# Patient Record
Sex: Female | Born: 1990
Health system: Southern US, Community
[De-identification: ages and names within clinical notes are randomized; demographics above are authoritative.]

## PROBLEM LIST (undated history)

## (undated) ENCOUNTER — Inpatient Hospital Stay (HOSPITAL_COMMUNITY): Payer: Self-pay

## (undated) DIAGNOSIS — R079 Chest pain, unspecified: Secondary | ICD-10-CM

## (undated) DIAGNOSIS — E538 Deficiency of other specified B group vitamins: Secondary | ICD-10-CM

## (undated) DIAGNOSIS — D649 Anemia, unspecified: Secondary | ICD-10-CM

## (undated) DIAGNOSIS — F419 Anxiety disorder, unspecified: Secondary | ICD-10-CM

## (undated) DIAGNOSIS — K219 Gastro-esophageal reflux disease without esophagitis: Secondary | ICD-10-CM

## (undated) DIAGNOSIS — R0600 Dyspnea, unspecified: Secondary | ICD-10-CM

## (undated) DIAGNOSIS — R7303 Prediabetes: Secondary | ICD-10-CM

## (undated) DIAGNOSIS — F209 Schizophrenia, unspecified: Secondary | ICD-10-CM

## (undated) DIAGNOSIS — F329 Major depressive disorder, single episode, unspecified: Secondary | ICD-10-CM

## (undated) DIAGNOSIS — M549 Dorsalgia, unspecified: Secondary | ICD-10-CM

## (undated) DIAGNOSIS — F319 Bipolar disorder, unspecified: Secondary | ICD-10-CM

## (undated) DIAGNOSIS — R002 Palpitations: Secondary | ICD-10-CM

## (undated) DIAGNOSIS — K5900 Constipation, unspecified: Secondary | ICD-10-CM

## (undated) DIAGNOSIS — R42 Dizziness and giddiness: Secondary | ICD-10-CM

## (undated) DIAGNOSIS — E559 Vitamin D deficiency, unspecified: Secondary | ICD-10-CM

## (undated) HISTORY — DX: Dizziness and giddiness: R42

## (undated) HISTORY — DX: Constipation, unspecified: K59.00

## (undated) HISTORY — DX: Bipolar disorder, unspecified: F31.9

## (undated) HISTORY — DX: Gastro-esophageal reflux disease without esophagitis: K21.9

## (undated) HISTORY — DX: Schizophrenia, unspecified: F20.9

## (undated) HISTORY — DX: Major depressive disorder, single episode, unspecified: F32.9

## (undated) HISTORY — DX: Anemia, unspecified: D64.9

## (undated) HISTORY — DX: Dyspnea, unspecified: R06.00

## (undated) HISTORY — DX: Deficiency of other specified B group vitamins: E53.8

## (undated) HISTORY — DX: Vitamin D deficiency, unspecified: E55.9

## (undated) HISTORY — DX: Dorsalgia, unspecified: M54.9

## (undated) HISTORY — DX: Anxiety disorder, unspecified: F41.9

## (undated) HISTORY — DX: Prediabetes: R73.03

## (undated) HISTORY — PX: HAND SURGERY: SHX662

## (undated) HISTORY — DX: Chest pain, unspecified: R07.9

## (undated) HISTORY — DX: Palpitations: R00.2

---

## 2009-08-25 ENCOUNTER — Ambulatory Visit (HOSPITAL_COMMUNITY): Admission: EM | Admit: 2009-08-25 | Discharge: 2009-08-25 | Payer: Self-pay | Admitting: Emergency Medicine

## 2010-01-27 ENCOUNTER — Emergency Department (HOSPITAL_COMMUNITY): Admission: EM | Admit: 2010-01-27 | Discharge: 2010-01-28 | Payer: Self-pay | Admitting: Emergency Medicine

## 2010-07-02 LAB — CBC
HCT: 38 % (ref 36.0–46.0)
Hemoglobin: 12.7 g/dL (ref 12.0–15.0)
WBC: 6.2 10*3/uL (ref 4.0–10.5)

## 2010-07-02 LAB — URINALYSIS, ROUTINE W REFLEX MICROSCOPIC
Bilirubin Urine: NEGATIVE
Nitrite: NEGATIVE
Protein, ur: NEGATIVE mg/dL
Urobilinogen, UA: 0.2 mg/dL (ref 0.0–1.0)

## 2010-07-02 LAB — POCT I-STAT, CHEM 8
Hemoglobin: 14.6 g/dL (ref 12.0–15.0)
Sodium: 139 mEq/L (ref 135–145)
TCO2: 22 mmol/L (ref 0–100)

## 2010-07-02 LAB — URINE MICROSCOPIC-ADD ON

## 2010-07-02 LAB — GC/CHLAMYDIA PROBE AMP, GENITAL
Chlamydia, DNA Probe: NEGATIVE
GC Probe Amp, Genital: NEGATIVE

## 2010-07-02 LAB — DIFFERENTIAL
Lymphocytes Relative: 4 % — ABNORMAL LOW (ref 12–46)
Monocytes Absolute: 0.2 10*3/uL (ref 0.1–1.0)
Monocytes Relative: 3 % (ref 3–12)
Neutro Abs: 5.7 10*3/uL (ref 1.7–7.7)

## 2010-07-02 LAB — WET PREP, GENITAL: Clue Cells Wet Prep HPF POC: NONE SEEN

## 2010-07-02 LAB — POCT PREGNANCY, URINE: Preg Test, Ur: NEGATIVE

## 2010-07-07 LAB — POCT I-STAT, CHEM 8
Glucose, Bld: 95 mg/dL (ref 70–99)
HCT: 41 % (ref 36.0–46.0)
Hemoglobin: 13.9 g/dL (ref 12.0–15.0)
Potassium: 4.1 mEq/L (ref 3.5–5.1)
Sodium: 141 mEq/L (ref 135–145)

## 2010-10-28 ENCOUNTER — Inpatient Hospital Stay (HOSPITAL_COMMUNITY)
Admission: EM | Admit: 2010-10-28 | Payer: Federal, State, Local not specified - PPO | Source: Ambulatory Visit | Admitting: Obstetrics and Gynecology

## 2010-10-28 ENCOUNTER — Inpatient Hospital Stay (HOSPITAL_COMMUNITY): Payer: Federal, State, Local not specified - PPO

## 2010-10-28 ENCOUNTER — Telehealth (HOSPITAL_COMMUNITY): Payer: Self-pay | Admitting: *Deleted

## 2010-10-28 ENCOUNTER — Encounter (HOSPITAL_COMMUNITY): Payer: Self-pay

## 2010-10-28 ENCOUNTER — Inpatient Hospital Stay (HOSPITAL_COMMUNITY)
Admission: EM | Admit: 2010-10-28 | Discharge: 2010-10-28 | Disposition: A | Discharge status: Home / Self Care | Payer: Federal, State, Local not specified - PPO | Source: Ambulatory Visit | Attending: Obstetrics and Gynecology | Admitting: Obstetrics and Gynecology

## 2010-10-28 DIAGNOSIS — O99891 Other specified diseases and conditions complicating pregnancy: Secondary | ICD-10-CM | POA: Insufficient documentation

## 2010-10-28 LAB — HCG, QUANTITATIVE, PREGNANCY: hCG, Beta Chain, Quant, S: 86089 m[IU]/mL — ABNORMAL HIGH (ref <5)

## 2010-10-28 LAB — ABO/RH: ABO/RH(D): O POS

## 2010-10-28 LAB — CBC
Hemoglobin: 12.2 g/dL (ref 12.0–15.0)
MCHC: 33.5 g/dL (ref 30.0–36.0)
RBC: 4.44 MIL/uL (ref 3.87–5.11)

## 2010-10-28 LAB — WET PREP, GENITAL: Clue Cells Wet Prep HPF POC: NONE SEEN

## 2010-10-28 MED ORDER — ONDANSETRON 8 MG PO TBDP: 8.0000 mg | ORAL_TABLET | Freq: Three times a day (TID) | ORAL | Status: AC | PRN

## 2010-10-28 MED ORDER — PROMETHAZINE HCL 25 MG PO TABS: 25.0000 mg | ORAL_TABLET | Freq: Four times a day (QID) | ORAL | Status: AC | PRN

## 2010-10-28 NOTE — ED Notes (Signed)
Wende Bushy, NP in to discuss hx and current status.

## 2010-10-28 NOTE — ED Notes (Signed)
To US

## 2010-10-28 NOTE — ED Notes (Signed)
Sydney Dominguez discussed results and D/C instrs. Pt. Was given a pregnancy verification letter and list of providers.

## 2010-10-28 NOTE — Progress Notes (Signed)
Pt states she has been having nausea and has vomited x 3 over the past 1 1/2 weeks. No nausea today. Pt was seen at [facility] [address] and diagnosed with pregnancy. Pt was told that during her exam her cervix was open 1 cm with white tissue seen and was sent to MAU for further evaluation. Pt is not having any pain or bleeding at this time.

## 2010-10-28 NOTE — ED Provider Notes (Addendum)
History     Chief Complaint  Patient presents with  . Referral    pt seen at [facility] with c/o of nausea for 1/2 weeks with neg HPT and stingy vaginal discharge for 2 days with some cramping.   HPI     Past Medical History  Diagnosis Date  . No pertinent past medical history     Past Surgical History  Procedure Date  . Hand surgery     No family history on file.  History  Substance Use Topics  . Smoking status: Never Smoker   . Smokeless tobacco: Not on file  . Alcohol Use: Yes     1 liquor drink per month    Allergies: No Known Allergies  Prescriptions prior to admission  Medication Sig Dispense Refill  . Ascorbic Acid (VITAMIN C PO) Take 1 tablet by mouth daily.        Marland Kitchen bismuth subsalicylate (PEPTO BISMOL) 262 MG/15ML suspension Take 15 mLs by mouth 2 (two) times daily. For upset stomach        . OVER THE COUNTER MEDICATION Take 2 tablets by mouth daily. Hair and nail supplement       . Polyethylene Glycol 3350 (MIRALAX PO) Take 0.5 capsules by mouth once.          Review of Systems  Constitutional: Negative.  Negative for fever and chills.  Gastrointestinal: Positive for nausea, vomiting, diarrhea and constipation.  Genitourinary: Negative for dysuria.   Physical Exam   Blood pressure 126/72, pulse 99, temperature 98.4 F (36.9 C), temperature source Oral, resp. rate 20, height 5\' 4"  (1.626 m), weight 221 lb 9.6 oz (100.517 kg), last menstrual period 10/13/2010.  Physical Exam  Vitals reviewed. Constitutional: She is oriented to person, place, and time. She appears well-developed and well-nourished.  HENT:  Head: Normocephalic.  Eyes: Pupils are equal, round, and reactive to light.  Neck: Neck supple.  Respiratory: Effort normal.  GI: Soft. She exhibits no distension and no mass. There is no tenderness. There is no rebound and no guarding.  Genitourinary: Vagina normal. No vaginal discharge found.       Cervix is dilated to 1.5 cm with  white tissue exposed.  No bleeding or discharge noted  Musculoskeletal: Normal range of motion.  Neurological: She is alert and oriented to person, place, and time.  Skin: Skin is warm and dry.  Psychiatric: She has a normal mood and affect. Her behavior is normal.    MAU Course  Procedures  MDM  Pregnancy with cervical dilatation Wet prep and GC/chlamydia cultures taken

## 2010-10-28 NOTE — ED Notes (Signed)
Returned from US  Awaiting report

## 2010-11-28 ENCOUNTER — Encounter (HOSPITAL_COMMUNITY): Payer: Self-pay | Admitting: *Deleted

## 2010-11-28 ENCOUNTER — Inpatient Hospital Stay (HOSPITAL_COMMUNITY)
Admission: AD | Admit: 2010-11-28 | Discharge: 2010-11-28 | Disposition: A | Discharge status: Home / Self Care | Payer: Federal, State, Local not specified - PPO | Source: Ambulatory Visit | Attending: Obstetrics & Gynecology | Admitting: Obstetrics & Gynecology

## 2010-11-28 DIAGNOSIS — O200 Threatened abortion: Secondary | ICD-10-CM | POA: Insufficient documentation

## 2010-11-28 DIAGNOSIS — O4590 Premature separation of placenta, unspecified, unspecified trimester: Secondary | ICD-10-CM | POA: Insufficient documentation

## 2010-11-28 DIAGNOSIS — O418X90 Other specified disorders of amniotic fluid and membranes, unspecified trimester, not applicable or unspecified: Secondary | ICD-10-CM

## 2010-11-28 LAB — URINALYSIS, ROUTINE W REFLEX MICROSCOPIC
Glucose, UA: NEGATIVE mg/dL
Ketones, ur: NEGATIVE mg/dL
Leukocytes, UA: NEGATIVE
pH: 7.5 (ref 5.0–8.0)

## 2010-11-28 LAB — CBC
HCT: 36.7 % (ref 36.0–46.0)
Hemoglobin: 12.4 g/dL (ref 12.0–15.0)
MCH: 27.9 pg (ref 26.0–34.0)
MCHC: 33.8 g/dL (ref 30.0–36.0)
MCV: 82.7 fL (ref 78.0–100.0)
RBC: 4.44 MIL/uL (ref 3.87–5.11)

## 2010-11-28 LAB — URINE MICROSCOPIC-ADD ON

## 2010-11-28 LAB — WET PREP, GENITAL: Clue Cells Wet Prep HPF POC: NONE SEEN

## 2010-11-28 NOTE — ED Provider Notes (Signed)
History     CSN: [id number] Arrival date & time: 11/28/2010 10:44 AM  Chief Complaint  Patient presents with  . Vaginal Bleeding   HPI Sydney Dominguez is a 20 y.o. AA female @ [redacted] weeks gestation who presents to MAU with vaginal bleeding that started last night with low back pain and lower abdominal cramping. She was evaluated here last month and had an ultrasound that showed a 7 week 4 days IUP with cardiac activity 146. There was a small SCH and small FF. She has not had sex since her previous visit. The history was provided by the patient.     Past Medical History  Diagnosis Date  . No pertinent past medical history     Past Surgical History  Procedure Date  . Hand surgery     No family history on file.  History  Substance Use Topics  . Smoking status: Never Smoker   . Smokeless tobacco: Not on file  . Alcohol Use: No    OB History    Grav Para Term Preterm Abortions TAB SAB Ect Mult Living   1               Review of Systems  Constitutional: Positive for fatigue. Negative for fever and chills.  HENT: Negative.   Eyes: Negative.   Respiratory: Negative.   Cardiovascular: Negative.   Gastrointestinal: Positive for abdominal pain. Negative for nausea and vomiting.  Genitourinary: Positive for frequency and vaginal bleeding. Negative for dyspareunia.  Musculoskeletal: Positive for back pain.  Skin: Negative.   Neurological: Negative for headaches.    Physical Exam  BP 131/69  Pulse 77  Temp(Src) 98.2 F (36.8 C) (Oral)  Resp 16  Ht 5\' 5"  (1.651 m)  Wt 222 lb 9.6 oz (100.971 kg)  BMI 37.04 kg/m2  LMP 10/13/2010  Physical Exam  Nursing note and vitals reviewed. Constitutional: She is oriented to person, place, and time. She appears well-developed and well-nourished.  HENT:  Head: Normocephalic.  Eyes: EOM are normal.  Neck: Neck supple.  Pulmonary/Chest: Effort normal.  Abdominal: Soft. There is no tenderness.  Genitourinary:       There is a small  amount of blood in the vaginal vault. The cervix is closed. The uterus is a 12 week size.   Musculoskeletal: Normal range of motion.  Neurological: She is alert and oriented to person, place, and time. No cranial nerve deficit.  Skin: Skin is warm and dry.    ED Course  Procedures  MDM I discussed with the patient the findings of the ultrasound from last month and explained [facility]. I will give her instructions regarding threatened AB. She is to follow up with the health department or return here as needed. Informal bedside ultrasound, patient able to visualize very active fetus and cardiac activity. FHT were also heard with doppler.  Assessment: Vaginal bleeding at [redacted] weeks gestation  Plan: pelvic rest, follow up with [name]           Return here as needed.

## 2010-11-28 NOTE — Progress Notes (Signed)
Pt states "episode of vag blding with small bld clot this am, no blding now"

## 2010-11-28 NOTE — Progress Notes (Signed)
Went to bathroom this morning passed tiny blood clot and some blood in toilet, onset of pain in lower back since last night with mild cramping today, G1  11 weeks

## 2010-11-30 LAB — GC/CHLAMYDIA PROBE AMP, GENITAL
Chlamydia, DNA Probe: NEGATIVE
GC Probe Amp, Genital: NEGATIVE

## 2010-12-04 LAB — HEPATITIS B SURFACE ANTIGEN: Hepatitis B Surface Ag: NEGATIVE

## 2010-12-04 LAB — RUBELLA ANTIBODY, IGM: Rubella: IMMUNE

## 2010-12-04 LAB — RPR: RPR: NONREACTIVE

## 2010-12-09 ENCOUNTER — Other Ambulatory Visit: Payer: Self-pay | Admitting: Obstetrics and Gynecology

## 2010-12-09 ENCOUNTER — Other Ambulatory Visit (HOSPITAL_COMMUNITY)
Admission: RE | Admit: 2010-12-09 | Discharge: 2010-12-09 | Disposition: A | Discharge status: Home / Self Care | Payer: Federal, State, Local not specified - PPO | Source: Ambulatory Visit | Attending: Obstetrics and Gynecology | Admitting: Obstetrics and Gynecology

## 2010-12-09 DIAGNOSIS — Z01419 Encounter for gynecological examination (general) (routine) without abnormal findings: Secondary | ICD-10-CM | POA: Insufficient documentation

## 2010-12-09 DIAGNOSIS — Z113 Encounter for screening for infections with a predominantly sexual mode of transmission: Secondary | ICD-10-CM | POA: Insufficient documentation

## 2011-02-18 NOTE — ED Provider Notes (Signed)
Attestation of Attending Supervision of Advanced Practitioner: Evaluation and management procedures were performed by the PA/NP/CNM/OB Fellow under my supervision/collaboration. Chart reviewed and agree with management and plan.  Jaynie Collins A M.D. 02/18/2011 2:51 PM

## 2011-02-22 NOTE — ED Provider Notes (Signed)
Agree with above note.  Va Broadwell 02/22/2011 10:55 AM

## 2011-04-20 NOTE — L&D Delivery Note (Signed)
Delivery Note At  a viable female was delivered via  (Presentation:LOA ;  ).  APGAR:9,9 , ; weight 8#4 .   Placenta status:delivered complete and intact , .  Cord:  Leg cord with the following complications: none.  Cord pH: N/A  Anesthesia:  CLE Episiotomy: none Lacerations: abrasions  Suture Repair: na Est. Blood Loss (300 mL):   Mom to postpartum.  Baby to nursery-stable.  Sydney Dominguez, Wyszynski H. 06/05/2011, 5:35 PM

## 2011-05-12 LAB — STREP B DNA PROBE: GBS: POSITIVE

## 2011-05-28 ENCOUNTER — Ambulatory Visit (HOSPITAL_COMMUNITY)
Admission: RE | Admit: 2011-05-28 | Discharge: 2011-05-28 | Disposition: A | Discharge status: Home / Self Care | Payer: Federal, State, Local not specified - PPO | Source: Ambulatory Visit | Attending: Obstetrics and Gynecology | Admitting: Obstetrics and Gynecology

## 2011-05-28 ENCOUNTER — Other Ambulatory Visit: Payer: Self-pay | Admitting: Obstetrics and Gynecology

## 2011-05-28 DIAGNOSIS — O99814 Abnormal glucose complicating childbirth: Secondary | ICD-10-CM

## 2011-05-28 DIAGNOSIS — Z3689 Encounter for other specified antenatal screening: Secondary | ICD-10-CM | POA: Insufficient documentation

## 2011-05-28 DIAGNOSIS — O99810 Abnormal glucose complicating pregnancy: Secondary | ICD-10-CM | POA: Insufficient documentation

## 2011-05-31 ENCOUNTER — Inpatient Hospital Stay (HOSPITAL_COMMUNITY)
Admission: AD | Admit: 2011-05-31 | Discharge: 2011-05-31 | Disposition: A | Discharge status: Home / Self Care | Payer: Federal, State, Local not specified - PPO | Source: Ambulatory Visit | Attending: Obstetrics and Gynecology | Admitting: Obstetrics and Gynecology

## 2011-05-31 ENCOUNTER — Encounter (HOSPITAL_COMMUNITY): Payer: Self-pay

## 2011-05-31 DIAGNOSIS — O368390 Maternal care for abnormalities of the fetal heart rate or rhythm, unspecified trimester, not applicable or unspecified: Secondary | ICD-10-CM | POA: Insufficient documentation

## 2011-05-31 LAB — GLUCOSE, CAPILLARY: Glucose-Capillary: 89 mg/dL (ref 70–99)

## 2011-05-31 NOTE — Progress Notes (Signed)
At office on Thurs, trying to do NST, unable to do continuous.  Had [place] on FRi (BPP).  Office called her today and told her to come here.

## 2011-05-31 NOTE — Progress Notes (Signed)
Pt states is newly diagnosed gestational diabetic as of Thursday, at 37.4wks. +FM, noted spotting yesterday, denies lof.

## 2011-06-04 ENCOUNTER — Inpatient Hospital Stay (HOSPITAL_COMMUNITY)
Admission: AD | Admit: 2011-06-04 | Discharge: 2011-06-07 | DRG: 372 | Disposition: A | Discharge status: Home / Self Care | Payer: Federal, State, Local not specified - PPO | Source: Ambulatory Visit | Attending: Obstetrics and Gynecology | Admitting: Obstetrics and Gynecology

## 2011-06-04 ENCOUNTER — Other Ambulatory Visit (HOSPITAL_COMMUNITY): Payer: Self-pay | Admitting: Obstetrics and Gynecology

## 2011-06-04 ENCOUNTER — Ambulatory Visit (HOSPITAL_COMMUNITY)
Admission: RE | Admit: 2011-06-04 | Discharge: 2011-06-04 | Disposition: A | Discharge status: Home / Self Care | Payer: Federal, State, Local not specified - PPO | Source: Ambulatory Visit | Attending: Obstetrics and Gynecology | Admitting: Obstetrics and Gynecology

## 2011-06-04 ENCOUNTER — Encounter (HOSPITAL_COMMUNITY): Payer: Self-pay

## 2011-06-04 ENCOUNTER — Telehealth (HOSPITAL_COMMUNITY): Payer: Self-pay | Admitting: *Deleted

## 2011-06-04 ENCOUNTER — Encounter (HOSPITAL_COMMUNITY): Payer: Self-pay | Admitting: *Deleted

## 2011-06-04 DIAGNOSIS — O4100X0 Oligohydramnios, unspecified trimester, not applicable or unspecified: Principal | ICD-10-CM | POA: Diagnosis present

## 2011-06-04 DIAGNOSIS — Z22330 Carrier of Group B streptococcus: Secondary | ICD-10-CM

## 2011-06-04 DIAGNOSIS — O288 Other abnormal findings on antenatal screening of mother: Secondary | ICD-10-CM

## 2011-06-04 DIAGNOSIS — D649 Anemia, unspecified: Secondary | ICD-10-CM | POA: Diagnosis present

## 2011-06-04 DIAGNOSIS — O99814 Abnormal glucose complicating childbirth: Secondary | ICD-10-CM | POA: Diagnosis present

## 2011-06-04 DIAGNOSIS — O9902 Anemia complicating childbirth: Secondary | ICD-10-CM | POA: Diagnosis present

## 2011-06-04 DIAGNOSIS — O99892 Other specified diseases and conditions complicating childbirth: Secondary | ICD-10-CM | POA: Diagnosis present

## 2011-06-04 LAB — CBC
HCT: 31.5 % — ABNORMAL LOW (ref 36.0–46.0)
Hemoglobin: 9.9 g/dL — ABNORMAL LOW (ref 12.0–15.0)
RDW: 17.4 % — ABNORMAL HIGH (ref 11.5–15.5)
WBC: 9.7 10*3/uL (ref 4.0–10.5)

## 2011-06-04 LAB — GLUCOSE, CAPILLARY: Glucose-Capillary: 56 mg/dL — ABNORMAL LOW (ref 70–99)

## 2011-06-04 MED ORDER — TERBUTALINE SULFATE 1 MG/ML IJ SOLN: 0.2500 mg | Freq: Once | INTRAMUSCULAR | Status: AC | PRN

## 2011-06-04 MED ORDER — OXYTOCIN 20 UNITS IN LACTATED RINGERS INFUSION - SIMPLE: 125.0000 mL/h | Freq: Once | INTRAVENOUS | Status: DC

## 2011-06-04 MED ORDER — ZOLPIDEM TARTRATE 10 MG PO TABS: 10.0000 mg | ORAL_TABLET | Freq: Every evening | ORAL | Status: DC | PRN

## 2011-06-04 MED ORDER — DIPHENHYDRAMINE HCL 50 MG/ML IJ SOLN
50.0000 mg | Freq: Three times a day (TID) | INTRAMUSCULAR | Status: DC | PRN
  Administered 2011-06-04 – 2011-06-05 (×2): 50 mg via INTRAVENOUS
  Filled 2011-06-04 (×2): qty 1

## 2011-06-04 MED ORDER — BUTORPHANOL TARTRATE 2 MG/ML IJ SOLN
1.0000 mg | INTRAMUSCULAR | Status: DC | PRN
  Administered 2011-06-05 (×3): 1 mg via INTRAVENOUS
  Filled 2011-06-04 (×3): qty 1

## 2011-06-04 MED ORDER — LACTATED RINGERS IV SOLN
INTRAVENOUS | Status: DC
  Administered 2011-06-04 – 2011-06-05 (×2): via INTRAVENOUS
  Administered 2011-06-05: 125 mL/h via INTRAVENOUS

## 2011-06-04 MED ORDER — LACTATED RINGERS IV SOLN: 500.0000 mL | INTRAVENOUS | Status: DC | PRN

## 2011-06-04 MED ORDER — OXYTOCIN BOLUS FROM INFUSION
500.0000 mL | Freq: Once | INTRAVENOUS | Status: DC
  Filled 2011-06-04: qty 500
  Filled 2011-06-04: qty 1000

## 2011-06-04 MED ORDER — CITRIC ACID-SODIUM CITRATE 334-500 MG/5ML PO SOLN: 30.0000 mL | ORAL | Status: DC | PRN

## 2011-06-04 MED ORDER — IBUPROFEN 600 MG PO TABS: 600.0000 mg | ORAL_TABLET | Freq: Four times a day (QID) | ORAL | Status: DC | PRN

## 2011-06-04 MED ORDER — ONDANSETRON HCL 4 MG/2ML IJ SOLN: 4.0000 mg | Freq: Four times a day (QID) | INTRAMUSCULAR | Status: DC | PRN

## 2011-06-04 MED ORDER — PENICILLIN G POTASSIUM 5000000 UNITS IJ SOLR
5.0000 10*6.[IU] | Freq: Once | INTRAVENOUS | Status: AC
  Administered 2011-06-04: 5 10*6.[IU] via INTRAVENOUS
  Filled 2011-06-04: qty 5

## 2011-06-04 MED ORDER — MISOPROSTOL 25 MCG QUARTER TABLET
25.0000 ug | ORAL_TABLET | Freq: Once | ORAL | Status: AC
  Administered 2011-06-04: 25 ug via VAGINAL
  Filled 2011-06-04: qty 0.25

## 2011-06-04 MED ORDER — OXYCODONE-ACETAMINOPHEN 5-325 MG PO TABS: 1.0000 | ORAL_TABLET | ORAL | Status: DC | PRN

## 2011-06-04 MED ORDER — PENICILLIN G POTASSIUM 5000000 UNITS IJ SOLR
2.5000 10*6.[IU] | INTRAVENOUS | Status: DC
  Administered 2011-06-04 – 2011-06-05 (×7): 2.5 10*6.[IU] via INTRAVENOUS
  Filled 2011-06-04 (×10): qty 2.5

## 2011-06-04 MED ORDER — ACETAMINOPHEN 325 MG PO TABS: 650.0000 mg | ORAL_TABLET | ORAL | Status: DC | PRN

## 2011-06-04 MED ORDER — FLEET ENEMA 7-19 GM/118ML RE ENEM: 1.0000 | ENEMA | RECTAL | Status: DC | PRN

## 2011-06-04 MED ORDER — LIDOCAINE HCL (PF) 1 % IJ SOLN
30.0000 mL | INTRAMUSCULAR | Status: DC | PRN
  Filled 2011-06-04: qty 30

## 2011-06-04 NOTE — Progress Notes (Signed)
1923 Patient given juice, has ordered dinner, is in no acute distress.

## 2011-06-04 NOTE — Progress Notes (Signed)
Sydney Dominguez is a 21 y.o. G1P0000 at [redacted]w[redacted]d by LMP admitted for induction of labor due to Low amniotic fluid. and Gestational diabetes., with reassuring BPP 8/10  Subjective:   comfortable and friendly  Objective: BP 133/67  Pulse 89  Temp(Src) 98.4 F (36.9 C) (Oral)  Resp 18  Ht 5\' 5"  (1.651 m)  Wt 119.296 kg (263 lb)  BMI 43.77 kg/m2  LMP 10/13/2010      FHT:  FHR: 140's bpm, variability: marked,  accelerations:  Present,  decelerations:  Absent UC:   irregular, every 2-4 minutes SVE:   Dilation: 2 Effacement (%): 80 Station: -1 Exam by:: Montez Morita, RNC  Labs: Lab Results  Component Value Date   WBC 9.7 06/04/2011   HGB 9.9* 06/04/2011   HCT 31.5* 06/04/2011   MCV 77.8* 06/04/2011   PLT 371 06/04/2011    Assessment / Plan: oligohydramnios at term 38w6 A2DM Obesity Unfavorable cervix Reassuring FHR GBBS anemia  Labor: has had 2 cytotec Preeclampsia:  n/a Fetal Wellbeing:  Category I Pain Control:  plans on epidural and would like ambien for sleep aid I/D:  on pcn for GBBS Anticipated MOD:  NSVD  Orissa Arreaga, Taitano H. 06/04/2011, 11:23 PM

## 2011-06-04 NOTE — Telephone Encounter (Signed)
Preadmission screen  

## 2011-06-04 NOTE — H&P (Signed)
Sydney Dominguez is a 21 y.o. female presenting for  Induction at 38 wks and 6 days ( based on 7 wk u/s with EDD 06/12/2011)  due to Oligohydramnios with AFI of 4 on u/s today. Her pregnancy has been complicated by A2 DM diagnosed in the 3rd trimester .She is GBS positive  Her last EFW on 10/23/2011 in our office was 7lbs 6 oz. She reports +FM no lof no vaginal bleeding. Irregular contractions.  OB History    Grav Para Term Preterm Abortions TAB SAB Ect Mult Living   1 0 0 0 0 0 0 0 0 0      Past Medical History  Diagnosis Date  . Asthma   . Gestational diabetes     glyburide   Reports childhood asthma.. She denies difficulty with asthma as an adult  Past Surgical History  Procedure Date  . Hand surgery     l hand   Family History: family history includes Cancer in her maternal grandfather; Diabetes in her maternal grandmother, mother, and paternal grandmother; and Hypertension in her father.  There is no history of Anesthesia problems. Social History:  reports that she quit smoking about 2 years ago. She has never used smokeless tobacco. She reports that she does not drink alcohol or use illicit drugs.  ROS negative except as stated in HPI   Dilation: 1.5 Effacement (%): 60 Station: -1 Exam by:: Montez Morita, RNC Blood pressure 126/66, pulse 93, temperature 98.2 F (36.8 C), temperature source Oral, resp. rate 18, height 5\' 5"  (1.651 m), weight 119.296 kg (263 lb), last menstrual period 10/13/2010. CV RRR Lungs Clear Abd gravid nontender Ext trace edema  Prenatal labs: ABO, Rh: --/--/O POS (07/11 1720) Antibody: Negative (08/17 0000) Rubella:  Immune  RPR: Nonreactive (08/17 0000)  HBsAg: Negative (08/17 0000)  HIV: Non-reactive (08/17 0000)  GBS: Positive (01/23 0000)   Assessment/Plan: 38 wks and 6 days with Oligohydramnios, A2 DM for induction GBS positive -penicillin for prophylaxis..  Dr. Forestine Chute covering after 1700 on 06/04/2011   Pleasant Britz J. 06/04/2011, 4:07  PM

## 2011-06-05 ENCOUNTER — Inpatient Hospital Stay (HOSPITAL_COMMUNITY): Payer: Federal, State, Local not specified - PPO | Admitting: Anesthesiology

## 2011-06-05 ENCOUNTER — Encounter (HOSPITAL_COMMUNITY): Payer: Self-pay | Admitting: Anesthesiology

## 2011-06-05 ENCOUNTER — Encounter (HOSPITAL_COMMUNITY): Payer: Self-pay | Admitting: Emergency Medicine

## 2011-06-05 LAB — GLUCOSE, CAPILLARY
Glucose-Capillary: 73 mg/dL (ref 70–99)
Glucose-Capillary: 89 mg/dL (ref 70–99)

## 2011-06-05 MED ORDER — TERBUTALINE SULFATE 1 MG/ML IJ SOLN: 0.2500 mg | Freq: Once | INTRAMUSCULAR | Status: DC | PRN

## 2011-06-05 MED ORDER — LACTATED RINGERS IV SOLN
500.0000 mL | Freq: Once | INTRAVENOUS | Status: DC
Start: 2011-06-05 — End: 2011-06-05

## 2011-06-05 MED ORDER — FENTANYL 2.5 MCG/ML BUPIVACAINE 1/10 % EPIDURAL INFUSION (WH - ANES)
14.0000 mL/h | INTRAMUSCULAR | Status: DC
  Administered 2011-06-05 (×2): 14 mL/h via EPIDURAL
  Filled 2011-06-05 (×3): qty 60

## 2011-06-05 MED ORDER — LANOLIN HYDROUS EX OINT: TOPICAL_OINTMENT | CUTANEOUS | Status: DC | PRN

## 2011-06-05 MED ORDER — PHENYLEPHRINE 40 MCG/ML (10ML) SYRINGE FOR IV PUSH (FOR BLOOD PRESSURE SUPPORT)
80.0000 ug | PREFILLED_SYRINGE | INTRAVENOUS | Status: DC | PRN
  Filled 2011-06-05: qty 5

## 2011-06-05 MED ORDER — ZOLPIDEM TARTRATE 5 MG PO TABS: 5.0000 mg | ORAL_TABLET | Freq: Every evening | ORAL | Status: DC | PRN

## 2011-06-05 MED ORDER — SIMETHICONE 80 MG PO CHEW: 80.0000 mg | CHEWABLE_TABLET | ORAL | Status: DC | PRN

## 2011-06-05 MED ORDER — EPHEDRINE 5 MG/ML INJ
10.0000 mg | INTRAVENOUS | Status: DC | PRN
  Filled 2011-06-05: qty 4

## 2011-06-05 MED ORDER — FENTANYL 2.5 MCG/ML BUPIVACAINE 1/10 % EPIDURAL INFUSION (WH - ANES)
INTRAMUSCULAR | Status: DC | PRN
  Administered 2011-06-05: 13 mL/h via EPIDURAL

## 2011-06-05 MED ORDER — PHENYLEPHRINE 40 MCG/ML (10ML) SYRINGE FOR IV PUSH (FOR BLOOD PRESSURE SUPPORT): 80.0000 ug | PREFILLED_SYRINGE | INTRAVENOUS | Status: DC | PRN

## 2011-06-05 MED ORDER — SODIUM CHLORIDE 0.9 % IV SOLN: 250.0000 mL | INTRAVENOUS | Status: DC | PRN

## 2011-06-05 MED ORDER — DIPHENHYDRAMINE HCL 50 MG/ML IJ SOLN: 12.5000 mg | INTRAMUSCULAR | Status: DC | PRN

## 2011-06-05 MED ORDER — PRENATAL MULTIVITAMIN CH
1.0000 | ORAL_TABLET | Freq: Every day | ORAL | Status: DC
  Administered 2011-06-06 – 2011-06-07 (×2): 1 via ORAL
  Filled 2011-06-05 (×2): qty 1

## 2011-06-05 MED ORDER — OXYCODONE-ACETAMINOPHEN 5-325 MG PO TABS
1.0000 | ORAL_TABLET | ORAL | Status: DC | PRN
  Administered 2011-06-06 (×2): 1 via ORAL
  Filled 2011-06-05 (×2): qty 1

## 2011-06-05 MED ORDER — WITCH HAZEL-GLYCERIN EX PADS: 1.0000 "application " | MEDICATED_PAD | CUTANEOUS | Status: DC | PRN

## 2011-06-05 MED ORDER — SENNOSIDES-DOCUSATE SODIUM 8.6-50 MG PO TABS
2.0000 | ORAL_TABLET | Freq: Every day | ORAL | Status: DC
  Administered 2011-06-06: 2 via ORAL

## 2011-06-05 MED ORDER — IBUPROFEN 600 MG PO TABS
600.0000 mg | ORAL_TABLET | Freq: Four times a day (QID) | ORAL | Status: DC
  Administered 2011-06-05 – 2011-06-07 (×7): 600 mg via ORAL
  Filled 2011-06-05 (×7): qty 1

## 2011-06-05 MED ORDER — DIPHENHYDRAMINE HCL 25 MG PO CAPS: 25.0000 mg | ORAL_CAPSULE | Freq: Four times a day (QID) | ORAL | Status: DC | PRN

## 2011-06-05 MED ORDER — ONDANSETRON HCL 4 MG PO TABS: 4.0000 mg | ORAL_TABLET | ORAL | Status: DC | PRN

## 2011-06-05 MED ORDER — SODIUM CHLORIDE 0.9 % IJ SOLN: 3.0000 mL | INTRAMUSCULAR | Status: DC | PRN

## 2011-06-05 MED ORDER — LIDOCAINE HCL (PF) 1 % IJ SOLN
INTRAMUSCULAR | Status: DC | PRN
  Administered 2011-06-05: 5 mL via SUBCUTANEOUS
  Administered 2011-06-05: 5 mL

## 2011-06-05 MED ORDER — INFLUENZA VIRUS VACC SPLIT PF IM SUSP
0.5000 mL | INTRAMUSCULAR | Status: AC | PRN
  Administered 2011-06-07: 0.5 mL via INTRAMUSCULAR

## 2011-06-05 MED ORDER — OXYTOCIN 20 UNITS IN LACTATED RINGERS INFUSION - SIMPLE
1.0000 m[IU]/min | INTRAVENOUS | Status: DC
  Administered 2011-06-05: 8 m[IU]/min via INTRAVENOUS
  Administered 2011-06-05: 2 m[IU]/min via INTRAVENOUS

## 2011-06-05 MED ORDER — BENZOCAINE-MENTHOL 20-0.5 % EX AERO
INHALATION_SPRAY | CUTANEOUS | Status: AC
  Filled 2011-06-05: qty 56

## 2011-06-05 MED ORDER — SODIUM CHLORIDE 0.9 % IJ SOLN: 3.0000 mL | Freq: Two times a day (BID) | INTRAMUSCULAR | Status: DC

## 2011-06-05 MED ORDER — DIBUCAINE 1 % RE OINT: 1.0000 "application " | TOPICAL_OINTMENT | RECTAL | Status: DC | PRN

## 2011-06-05 MED ORDER — TETANUS-DIPHTH-ACELL PERTUSSIS 5-2.5-18.5 LF-MCG/0.5 IM SUSP
0.5000 mL | Freq: Once | INTRAMUSCULAR | Status: AC
  Administered 2011-06-07: 0.5 mL via INTRAMUSCULAR
  Filled 2011-06-05: qty 0.5

## 2011-06-05 MED ORDER — ONDANSETRON HCL 4 MG/2ML IJ SOLN: 4.0000 mg | INTRAMUSCULAR | Status: DC | PRN

## 2011-06-05 MED ORDER — EPHEDRINE 5 MG/ML INJ: 10.0000 mg | INTRAVENOUS | Status: DC | PRN

## 2011-06-05 MED ORDER — BENZOCAINE-MENTHOL 20-0.5 % EX AERO: 1.0000 "application " | INHALATION_SPRAY | CUTANEOUS | Status: DC | PRN

## 2011-06-05 MED ORDER — NALOXONE HCL 0.4 MG/ML IJ SOLN
INTRAMUSCULAR | Status: AC
  Filled 2011-06-05: qty 1

## 2011-06-05 NOTE — Progress Notes (Signed)
Sydney Dominguez is a 21 y.o. G1P0000 at [redacted]w[redacted]d by LMP admitted for induction of labor due to Low amniotic fluid..  Subjective:  States she is feeling more pressure and was checked  Objective: BP 147/86  Pulse 129  Temp(Src) 99 F (37.2 C) (Oral)  Resp 18  Ht 5\' 5"  (1.651 m)  Wt 119.296 kg (263 lb)  BMI 43.77 kg/m2  LMP 10/13/2010      FHT:  FHR: 130's bpm, variability: moderate,  accelerations:  Present,  decelerations:  Absent UC:   irregular, every 2-5 minutes SVE:   Dilation: 7.5 Effacement (%): 100 Station: +1 Exam by:: k.forsell,rnc  Labs: Lab Results  Component Value Date   WBC 9.7 06/04/2011   HGB 9.9* 06/04/2011   HCT 31.5* 06/04/2011   MCV 77.8* 06/04/2011   PLT 371 06/04/2011    Assessment / Plan: Protracted active phase  oligohydramnios at term 38w6 A2DM Obesity Unfavorable cervix s/p cytotec x2 and now SROM with clear fluid Reassuring FHR GBBS anemia  Labor: pt seems to have adequate contractions and the timing is irregular that the addition of pitocin would be a problem at this point but I am concerned that the potential for a CSx is increasing Preeclampsia:  na Fetal Wellbeing:  Category I  Pain Control:  Epidural I/D:  on PCN  Anticipated MOD:  NSVD and but am concerned for increased chance for  aCSx  Gustin Zobrist, Forero H. 06/05/2011, 11:59 AM

## 2011-06-05 NOTE — Progress Notes (Signed)
Sydney Dominguez is a 21 y.o. G1P0000 at [redacted]w[redacted]d by LMP admitted for induction of labor due to Low amniotic fluid..  Subjective:  pushing  Objective: BP 125/51  Pulse 113  Temp(Src) 99.6 F (37.6 C) (Oral)  Resp 18  Ht 5\' 5"  (1.651 m)  Wt 119.296 kg (263 lb)  BMI 43.77 kg/m2  LMP 10/13/2010      FHT:  FHR: 130's bpm, variability: moderate,  accelerations:  Present,  decelerations:  Present variables with pushing noted UC:   irregular, every 1-4 minutes SVE:   Dilation: 10 Effacement (%): 100 Station: +1;+2 Exam by:: k.forsell,rnc  Labs: Lab Results  Component Value Date   WBC 9.7 06/04/2011   HGB 9.9* 06/04/2011   HCT 31.5* 06/04/2011   MCV 77.8* 06/04/2011   PLT 371 06/04/2011    Assessment / Plan: Induction of labor due to gestational diabetes and oligohydramnios,  progressing well on pitocin  Labor: 2nd stage Preeclampsia:  na Fetal Wellbeing:  Category II Pain Control:  Epidural I/D:  on pcn for GBS positive Anticipated MOD:  NSVD  Sydney Dominguez, Sydney Dominguez. 06/05/2011, 3:33 PM

## 2011-06-05 NOTE — Progress Notes (Signed)
Sydney Dominguez is a 21 y.o. G1P0000 at [redacted]w[redacted]d by [facility] [id number] admitted for induction of labor due to Low amniotic fluid and A2  gestational diabetes mellitus..  Subjective:  increasing pain and ROM by FERN noted and grossly ruptured  Objective: BP 113/69  Pulse 86  Temp(Src) 98.2 F (36.8 C) (Oral)  Resp 18  Ht 5\' 5"  (1.651 m)  Wt 119.296 kg (263 lb)  BMI 43.77 kg/m2  LMP 10/13/2010      FHT:  FHR: 140-150's bpm, variability: moderate,  accelerations:  Present,  decelerations:  Absent UC:   irregular, every 1-5 minutes SVE:   Dilation: 3.5 Effacement (%): 90 Station: -3 Exam by:: Dr. Christell Constant  Labs: Lab Results  Component Value Date   WBC 9.7 06/04/2011   HGB 9.9* 06/04/2011   HCT 31.5* 06/04/2011   MCV 77.8* 06/04/2011   PLT 371 06/04/2011    Assessment / Plan: oligohydramnios at term 38w6 A2DM Obesity Unfavorable cervix s/p cytotec x2 and now SROM with clear fluid Reassuring FHR GBBS anemia   Labor: Progressing normally Preeclampsia:  N/a Fetal Wellbeing:  Category I Pain Control:  Labor support without medications and Epidural I/D:  on PCN for GBBS Anticipated MOD:  NSVD  Feiga Nadel, Ortwein H. 06/05/2011, 1:51 AM

## 2011-06-05 NOTE — Anesthesia Procedure Notes (Signed)
Epidural Patient location during procedure: OB Start time: 06/05/2011 8:04 AM  Staffing Anesthesiologist: Brayton Caves R Performed by: anesthesiologist   Preanesthetic Checklist Completed: patient identified, site marked, surgical consent, pre-op evaluation, timeout performed, IV checked, risks and benefits discussed and monitors and equipment checked  Epidural Patient position: sitting Prep: site prepped and draped and DuraPrep Patient monitoring: continuous pulse ox and blood pressure Approach: midline Injection technique: LOR air and LOR saline  Needle:  Needle type: Tuohy  Needle gauge: 17 G Needle length: 9 cm Needle insertion depth: 9 cm Catheter type: closed end flexible Catheter size: 19 Gauge Catheter at skin depth: 14 cm Test dose: negative  Assessment Events: blood not aspirated, injection not painful, no injection resistance, negative IV test and no paresthesia  Additional Notes Patient identified.  Risk benefits discussed including failed block, incomplete pain control, headache, nerve damage, paralysis, blood pressure changes, nausea, vomiting, reactions to medication both toxic or allergic, and postpartum back pain.  Patient expressed understanding and wished to proceed.  All questions were answered.  Sterile technique used throughout procedure and epidural site dressed with sterile barrier dressing. No paresthesia or other complications noted.The patient did not experience any signs of intravascular injection such as tinnitus or metallic taste in mouth nor signs of intrathecal spread such as rapid motor block. Please see nursing notes for vital signs.

## 2011-06-05 NOTE — Progress Notes (Signed)
Sydney Dominguez is a 21 y.o. G1P0000 at [redacted]w[redacted]d by LMP admitted for induction of labor due to Low amniotic fluid..  Subjective:  Pushing and has more pressure Objective: BP 159/77  Pulse 155  Temp(Src) 99.6 F (37.6 C) (Oral)  Resp 18  Ht 5\' 5"  (1.651 m)  Wt 119.296 kg (263 lb)  BMI 43.77 kg/m2  LMP 10/13/2010      FHT:  FHR: 130's bpm, variability: moderate,  accelerations:  Present,  decelerations:  Present variables and starting to have slow RTB and hopefully delivery soon am prepared for intervention UC:   irregular, every 3-5 minutes SVE:   Dilation: 10 Effacement (%): 100 Station: +1;+2 Exam by:: k.forsell,rnc  Labs: Lab Results  Component Value Date   WBC 9.7 06/04/2011   HGB 9.9* 06/04/2011   HCT 31.5* 06/04/2011   MCV 77.8* 06/04/2011   PLT 371 06/04/2011    Assessment / Plan: pushing and has made progress to +3-+4 and hopefully vagianl delivery soon  Labor: second stage Preeclampsia:  na Fetal Wellbeing:  Category II Pain Control:  Epidural I/D:  on PCN for GBS Anticipated MOD:  NSVD  Sydney Dominguez, Perlstein H. 06/05/2011, 4:22 PM

## 2011-06-05 NOTE — Anesthesia Preprocedure Evaluation (Addendum)
Anesthesia Evaluation  Patient identified by MRN, date of birth, ID band Patient awake    Reviewed: Allergy & Precautions, H&P , Patient's Chart, lab work & pertinent test results  Airway Mallampati: III TM Distance: >3 FB Neck ROM: full    Dental No notable dental hx.    Pulmonary neg pulmonary ROS,  clear to auscultation  Pulmonary exam normal       Cardiovascular neg cardio ROS regular Normal    Neuro/Psych Negative Neurological ROS  Negative Psych ROS   GI/Hepatic negative GI ROS, Neg liver ROS,   Endo/Other  Negative Endocrine ROSDiabetes mellitus-Morbid obesity  Renal/GU negative Renal ROS     Musculoskeletal   Abdominal   Peds  Hematology negative hematology ROS (+)   Anesthesia Other Findings   Reproductive/Obstetrics (+) Pregnancy                           Anesthesia Physical Anesthesia Plan  ASA: III  Anesthesia Plan: Epidural   Post-op Pain Management:    Induction:   Airway Management Planned:   Additional Equipment:   Intra-op Plan:   Post-operative Plan:   Informed Consent: I have reviewed the patients History and Physical, chart, labs and discussed the procedure including the risks, benefits and alternatives for the proposed anesthesia with the patient or authorized representative who has indicated his/her understanding and acceptance.     Plan Discussed with:   Anesthesia Plan Comments:         Anesthesia Quick Evaluation  

## 2011-06-05 NOTE — Progress Notes (Signed)
Javen Ridings is a 21 y.o. G1P0000 at [redacted]w[redacted]d by LMP admitted for induction of labor due to Low amniotic fluid..  Subjective: Comfortable getting the CLE at that time.. Did not sleep well ... Objective: BP 135/81  Pulse 98  Temp(Src) 98.2 F (36.8 C) (Oral)  Resp 18  Ht 5\' 5"  (1.651 m)  Wt 119.296 kg (263 lb)  BMI 43.77 kg/m2  LMP 10/13/2010      FHT:  FHR: 120s bpm, variability: moderate,  accelerations:  Present,  decelerations:  Present mild to moderate intermittent and nonrepetitive variables UC:   irregular, every 1-3  minutes SVE:   Dilation: 7 Effacement (%): 100 Station: +1 Exam by:: K.FORSELL,RNC  Labs: Lab Results  Component Value Date   WBC 9.7 06/04/2011   HGB 9.9* 06/04/2011   HCT 31.5* 06/04/2011   MCV 77.8* 06/04/2011   PLT 371 06/04/2011    Assessment / Plan: see below oligohydramnios at term 38w6 A2DM Obesity Unfavorable cervix s/p cytotec x2 and now SROM with clear fluid Reassuring FHR GBBS anemia  Labor: Progressing normally eclampsia:  n/a Fetal Wellbeing:  Category II Pain Control:  Epidural I/D:  on PCN for GBS Anticipated MOD:  NSVD  Rinda Rollyson, Glaza H. 06/05/2011, 8:51 AM

## 2011-06-05 NOTE — Progress Notes (Signed)
Sydney Dominguez is a 21 y.o. G1P0000 at [redacted]w[redacted]d by LMP admitted for induction of labor due to Low amniotic fluid. and Gestational diabetes.  Subjective:   Objective: BP 113/69  Pulse 86  Temp(Src) 98.2 F (36.8 C) (Oral)  Resp 18  Ht 5\' 5"  (1.651 m)  Wt 119.296 kg (263 lb)  BMI 43.77 kg/m2  LMP 10/13/2010      FHT:  FHR: see other note bpm, variability: see other note,  accelerations:  Present,  decelerations:  Absent UC:   irregular, every see other note2 minutes SVE:   Dilation: 3.5 Effacement (%): 90 Station: -3 Exam by:: Dr. Christell Constant  Labs: Lab Results  Component Value Date   WBC 9.7 06/04/2011   HGB 9.9* 06/04/2011   HCT 31.5* 06/04/2011   MCV 77.8* 06/04/2011   PLT 371 06/04/2011    Assessment / Plan: see other note  Labor: see other note Preeclampsia:  see other note Fetal Wellbeing:  see other note Pain Control:  see other note I/D:  see other note Anticipated MOD:  see other note  Murice Barbar, Ager H. 06/05/2011, 1:49 AM   Somehow duplicate note please see other progress note for this time

## 2011-06-06 LAB — CBC
HCT: 30.9 % — ABNORMAL LOW (ref 36.0–46.0)
Hemoglobin: 9.7 g/dL — ABNORMAL LOW (ref 12.0–15.0)
MCH: 24.6 pg — ABNORMAL LOW (ref 26.0–34.0)
MCHC: 31.4 g/dL (ref 30.0–36.0)

## 2011-06-06 NOTE — Progress Notes (Signed)
Post Partum Day 1 Subjective: no complaints, up ad lib, voiding and tolerating PO  Objective: Blood pressure 120/79, pulse 96, temperature 98.1 F (36.7 C), temperature source Oral, resp. rate 20, height 5\' 5"  (1.651 m), weight 119.296 kg (263 lb), last menstrual period 10/13/2010, unknown if currently breastfeeding.  Physical Exam:  General: alert, cooperative and no distress Lochia: appropriate Uterine Fundus: firm Incision: na DVT Evaluation: No evidence of DVT seen on physical exam. Negative Homan's sign. No cords or calf tenderness.   Basename 06/06/11 0500 06/04/11 1123  HGB 9.7* 9.9*  HCT 30.9* 31.5*    Assessment/Plan: Plan for discharge tomorrow, Breastfeeding and Lactation consult   LOS: 2 days   Deidra Spease, Depaola H. 06/06/2011, 11:01 AM

## 2011-06-06 NOTE — Anesthesia Postprocedure Evaluation (Signed)
Anesthesia Post Note  Patient: Sydney Dominguez  Procedure(s) Performed: * No procedures listed *  Anesthesia type: Epidural  Patient location: Mother/Baby  Post pain: Pain level controlled  Post assessment: Post-op Vital signs reviewed  Last Vitals:  Filed Vitals:   06/06/11 0517  BP: 120/79  Pulse: 96  Temp: 36.7 C  Resp: 20    Post vital signs: Reviewed  Level of consciousness: awake  Complications: No apparent anesthesia complications

## 2011-06-07 ENCOUNTER — Encounter (HOSPITAL_COMMUNITY)
Admission: RE | Admit: 2011-06-07 | Discharge: 2011-06-07 | Disposition: A | Discharge status: Home / Self Care | Payer: Federal, State, Local not specified - PPO | Source: Ambulatory Visit | Attending: Obstetrics and Gynecology | Admitting: Obstetrics and Gynecology

## 2011-06-07 DIAGNOSIS — O923 Agalactia: Secondary | ICD-10-CM | POA: Insufficient documentation

## 2011-06-07 MED ORDER — IBUPROFEN 600 MG PO TABS: 600.0000 mg | ORAL_TABLET | Freq: Four times a day (QID) | ORAL | Status: AC | PRN

## 2011-06-07 MED ORDER — INFLUENZA VIRUS VACC SPLIT PF IM SUSP
0.5000 mL | INTRAMUSCULAR | Status: DC
  Filled 2011-06-07: qty 0.5

## 2011-06-07 NOTE — Discharge Summary (Signed)
Obstetric Discharge Summary Reason for Admission: induction of labor Oligohydramnios at 38 wks and 6 days  Prenatal Procedures: ultrasound Intrapartum Procedures: spontaneous vaginal delivery Postpartum Procedures: none Complications-Operative and Postpartum: none Hemoglobin  Date Value Range Status  06/06/2011 9.7* 12.0-15.0 (g/dL) Final     HCT  Date Value Range Status  06/06/2011 30.9* 36.0-46.0 (%) Final    Discharge Diagnoses: Term Pregnancy-delivered  Discharge Information: Date: 06/07/2011 Activity: pelvic rest Diet: routine and diabetic diet Medications: PNV, Ibuprofen and Iron Condition: stable Instructions: refer to practice specific booklet Discharge to: home Follow-up Information    Follow up with Geryl Rankins, MD. Call in 1 week. (bp check )    Contact information:   [address] [place], [place]. [address] [place] [address] [phone]          Newborn Data: Live born female  Birth Weight: 8 lb 4.9 oz (3768 g) APGAR: 9, 9  Home with mother.  Sydney Dominguez J. 06/07/2011, 8:38 AM

## 2011-06-07 NOTE — Progress Notes (Signed)
Post Partum Day 2 Subjective: no complaints, up ad lib, voiding and tolerating PO Denies headache blurred vision ruq pain.   Objective: Blood pressure 137/93, pulse 90, temperature 98 F (36.7 C), temperature source Oral, resp. rate 20, height 5\' 5"  (1.651 m), weight 119.296 kg (263 lb), last menstrual period 10/13/2010, unknown if currently breastfeeding.  Physical Exam:  General: alert and cooperative Lochia: appropriate Uterine Fundus: firm Incision: NA DVT Evaluation: No evidence of DVT seen on physical exam.   Basename 06/06/11 0500 06/04/11 1123  HGB 9.7* 9.9*  HCT 30.9* 31.5*    Assessment/Plan: Discharge home, Breastfeeding and Contraception desires abstinence Follow up in 1 wk for bp check in the office   LOS: 3 days   Severino Paolo J. 06/07/2011, 8:35 AM

## 2011-06-08 ENCOUNTER — Inpatient Hospital Stay (HOSPITAL_COMMUNITY): Admission: RE | Admit: 2011-06-08 | Payer: Federal, State, Local not specified - PPO | Source: Ambulatory Visit

## 2011-07-07 ENCOUNTER — Encounter (HOSPITAL_COMMUNITY)
Admission: RE | Admit: 2011-07-07 | Discharge: 2011-07-07 | Disposition: A | Discharge status: Home / Self Care | Payer: Federal, State, Local not specified - PPO | Source: Ambulatory Visit | Attending: Obstetrics and Gynecology | Admitting: Obstetrics and Gynecology

## 2011-07-07 DIAGNOSIS — O923 Agalactia: Secondary | ICD-10-CM | POA: Insufficient documentation

## 2011-08-07 ENCOUNTER — Encounter (HOSPITAL_COMMUNITY)
Admission: RE | Admit: 2011-08-07 | Discharge: 2011-08-07 | Disposition: A | Discharge status: Home / Self Care | Payer: Federal, State, Local not specified - PPO | Source: Ambulatory Visit | Attending: Obstetrics and Gynecology | Admitting: Obstetrics and Gynecology

## 2011-08-07 DIAGNOSIS — O923 Agalactia: Secondary | ICD-10-CM | POA: Insufficient documentation

## 2011-09-07 ENCOUNTER — Encounter (HOSPITAL_COMMUNITY)
Admission: RE | Admit: 2011-09-07 | Discharge: 2011-09-07 | Disposition: A | Discharge status: Home / Self Care | Payer: Federal, State, Local not specified - PPO | Source: Ambulatory Visit | Attending: Obstetrics and Gynecology | Admitting: Obstetrics and Gynecology

## 2011-09-07 DIAGNOSIS — O923 Agalactia: Secondary | ICD-10-CM | POA: Insufficient documentation

## 2011-10-08 ENCOUNTER — Encounter (HOSPITAL_COMMUNITY)
Admission: RE | Admit: 2011-10-08 | Discharge: 2011-10-08 | Disposition: A | Discharge status: Home / Self Care | Payer: Federal, State, Local not specified - PPO | Source: Ambulatory Visit | Attending: Obstetrics and Gynecology | Admitting: Obstetrics and Gynecology

## 2011-10-08 DIAGNOSIS — O923 Agalactia: Secondary | ICD-10-CM | POA: Insufficient documentation

## 2011-11-08 ENCOUNTER — Encounter (HOSPITAL_COMMUNITY)
Admission: RE | Admit: 2011-11-08 | Discharge: 2011-11-08 | Disposition: A | Discharge status: Home / Self Care | Payer: Federal, State, Local not specified - PPO | Source: Ambulatory Visit | Attending: Obstetrics and Gynecology | Admitting: Obstetrics and Gynecology

## 2011-11-08 DIAGNOSIS — O923 Agalactia: Secondary | ICD-10-CM | POA: Insufficient documentation

## 2011-12-09 ENCOUNTER — Encounter (HOSPITAL_COMMUNITY)
Admission: RE | Admit: 2011-12-09 | Discharge: 2011-12-09 | Disposition: A | Discharge status: Home / Self Care | Payer: Federal, State, Local not specified - PPO | Source: Ambulatory Visit | Attending: Obstetrics and Gynecology | Admitting: Obstetrics and Gynecology

## 2011-12-09 DIAGNOSIS — O923 Agalactia: Secondary | ICD-10-CM | POA: Insufficient documentation

## 2011-12-13 ENCOUNTER — Encounter (HOSPITAL_BASED_OUTPATIENT_CLINIC_OR_DEPARTMENT_OTHER): Payer: Self-pay | Admitting: *Deleted

## 2011-12-13 ENCOUNTER — Emergency Department (HOSPITAL_BASED_OUTPATIENT_CLINIC_OR_DEPARTMENT_OTHER)
Admission: EM | Admit: 2011-12-13 | Discharge: 2011-12-13 | Disposition: A | Discharge status: Home / Self Care | Payer: Federal, State, Local not specified - PPO | Attending: Emergency Medicine | Admitting: Emergency Medicine

## 2011-12-13 DIAGNOSIS — Z87891 Personal history of nicotine dependence: Secondary | ICD-10-CM | POA: Insufficient documentation

## 2011-12-13 DIAGNOSIS — R42 Dizziness and giddiness: Secondary | ICD-10-CM

## 2011-12-13 DIAGNOSIS — R079 Chest pain, unspecified: Secondary | ICD-10-CM | POA: Insufficient documentation

## 2011-12-13 LAB — CBC
Hemoglobin: 12.7 g/dL (ref 12.0–15.0)
MCH: 27.3 pg (ref 26.0–34.0)
MCHC: 33.6 g/dL (ref 30.0–36.0)
MCV: 81.1 fL (ref 78.0–100.0)

## 2011-12-13 LAB — BASIC METABOLIC PANEL
BUN: 12 mg/dL (ref 6–23)
CO2: 25 mEq/L (ref 19–32)
Calcium: 9.7 mg/dL (ref 8.4–10.5)
GFR calc non Af Amer: >90 mL/min (ref >90)
Glucose, Bld: 80 mg/dL (ref 70–99)

## 2011-12-13 MED ORDER — MECLIZINE HCL 32 MG PO TABS: 32.0000 mg | ORAL_TABLET | Freq: Three times a day (TID) | ORAL | Status: AC | PRN

## 2011-12-13 NOTE — ED Notes (Signed)
Pt amb to triage with quick steady gait in nad. Pt reports feeling dizzy off and on x 3 weeks. Pt states that "the room is spinning around" worse with moving or turning her head. Denies any pain, grips are = and strong, moe + x 4 ext, pearl, speech is clear, smile is symmetrical. Pt denies ha or other c/o, denies any visual changes.

## 2011-12-13 NOTE — ED Notes (Addendum)
PA at bedside.

## 2011-12-13 NOTE — ED Provider Notes (Signed)
History     CSN: [id number]  Arrival date & time 12/13/11  1310   First MD Initiated Contact with Patient 12/13/11 1413      Chief Complaint  Patient presents with  . Dizziness    (Consider location/radiation/quality/duration/timing/severity/associated sxs/prior treatment) Patient is a 21 y.o. female presenting with weakness and chest pain. The history is provided by the patient.  Weakness The primary symptoms include dizziness. Primary symptoms do not include fever, nausea or vomiting.  Dizziness also occurs with weakness. Dizziness does not occur with tinnitus, nausea or vomiting.  Additional symptoms include weakness. Additional symptoms do not include tinnitus. Associated symptoms comments: She reports infrequent dizziness that is positional, better when still. No associated nausea, or vomiting. No headaches. She denies hearing changes. Symptoms have been intermittent for the past 3 weeks. .  Chest Pain The chest pain began more than 2 weeks ago. Chest pain occurs intermittently. The chest pain is unchanged. Primary symptoms include dizziness. Pertinent negatives for primary symptoms include no fever, no shortness of breath, no nausea and no vomiting.  Dizziness also occurs with weakness. Dizziness does not occur with tinnitus, nausea or vomiting.  Associated symptoms include weakness. Associated symptoms comments: She reports episodes of chest pain that last for a few minutes and then resolve. No alleviating or aggravating factors. It does not radiate. Sometimes feels as if her heart is racing, and sometimes skipping beats. She feels a pulsating feeling in bilateral neck without pain. No lightheadedness. She has a complaint today also of dizziness but denies the chest pain occurs with the dizziness..     Past Medical History  Diagnosis Date  . Asthma   . Gestational diabetes     glyburide    Past Surgical History  Procedure Date  . Hand surgery     l hand    Family  History  Problem Relation Age of Onset  . Anesthesia problems Neg Hx   . Diabetes Mother   . Hypertension Father   . Diabetes Maternal Grandmother   . Cancer Maternal Grandfather     prostate  . Diabetes Paternal Grandmother     History  Substance Use Topics  . Smoking status: Former Smoker    Quit date: 06/03/2009  . Smokeless tobacco: Never Used  . Alcohol Use: No    OB History    Grav Para Term Preterm Abortions TAB SAB Ect Mult Living   1 1 1  0 0 0 0 0 0 1      Review of Systems  Constitutional: Negative for fever and chills.  HENT: Negative.  Negative for tinnitus.   Eyes: Negative for visual disturbance.  Respiratory: Negative.  Negative for shortness of breath.   Cardiovascular: Positive for chest pain.  Gastrointestinal: Negative.  Negative for nausea and vomiting.  Genitourinary: Negative for dysuria.  Musculoskeletal: Negative.   Skin: Negative.   Neurological: Positive for dizziness and weakness.    Allergies  Review of patient's allergies indicates no known allergies.  Home Medications  No current outpatient prescriptions on file.  BP 153/90  Pulse 66  Temp 97.5 F (36.4 C) (Oral)  Resp 20  Ht 5\' 3"  (1.6 m)  Wt 248 lb (112.492 kg)  BMI 43.93 kg/m2  SpO2 100%  LMP 12/06/2011  Physical Exam  Constitutional: She is oriented to person, place, and time. She appears well-developed and well-nourished.  HENT:  Head: Normocephalic.  Neck: Normal range of motion. Neck supple.  Cardiovascular: Normal rate and regular rhythm.  No murmur heard. Pulmonary/Chest: Effort normal and breath sounds normal. She has no wheezes. She has no rales.  Abdominal: Soft. Bowel sounds are normal. There is no tenderness. There is no rebound and no guarding.  Musculoskeletal: Normal range of motion.  Neurological: She is alert and oriented to person, place, and time. She has normal reflexes. Coordination normal.  Skin: Skin is warm and dry. No rash noted.  Psychiatric:  She has a normal mood and affect.    ED Course  Procedures (including critical care time)  Labs Reviewed  CBC - Abnormal; Notable for the following:    RDW 17.1 (*)     All other components within normal limits  BASIC METABOLIC PANEL   Results for orders placed during the hospital encounter of 12/13/11  BASIC METABOLIC PANEL      Component Value Range   Sodium 139  135 - 145 mEq/L   Potassium 3.8  3.5 - 5.1 mEq/L   Chloride 102  96 - 112 mEq/L   CO2 25  19 - 32 mEq/L   Glucose, Bld 80  70 - 99 mg/dL   BUN 12  6 - 23 mg/dL   Creatinine, Ser 0.98  0.50 - 1.10 mg/dL   Calcium 9.7  8.4 - 11.9 mg/dL   GFR calc non Af Amer >90  >90 mL/min   GFR calc Af Amer >90  >90 mL/min  CBC      Component Value Range   WBC 5.8  4.0 - 10.5 K/uL   RBC 4.66  3.87 - 5.11 MIL/uL   Hemoglobin 12.7  12.0 - 15.0 g/dL   HCT 14.7  82.9 - 56.2 %   MCV 81.1  78.0 - 100.0 fL   MCH 27.3  26.0 - 34.0 pg   MCHC 33.6  30.0 - 36.0 g/dL   RDW 13.0 (*) 86.5 - 78.4 %   Platelets 384  150 - 400 K/uL    Date: 12/13/2011  Rate: 57  Rhythm: sinus bradycardia  QRS Axis: normal  Intervals: normal  ST/T Wave abnormalities: normal  Conduction Disutrbances:none  Narrative Interpretation:   Old EKG Reviewed: none available   No results found.   No diagnosis found.  1. Vertigo 2. Nonspecific chest pain   MDM  The patient has been asymptomatic here. Lab tests and ekg all normal. Feel she is stable for discharge.         Rodena Medin, PA-C 12/13/11 1559

## 2011-12-19 NOTE — ED Provider Notes (Signed)
Medical screening examination/treatment/procedure(s) were performed by non-physician practitioner and as supervising physician I was immediately available for consultation/collaboration.   Marissa Lowrey, MD 12/19/11 0658 

## 2011-12-24 ENCOUNTER — Other Ambulatory Visit: Payer: Self-pay | Admitting: Obstetrics and Gynecology

## 2011-12-24 ENCOUNTER — Other Ambulatory Visit (HOSPITAL_COMMUNITY)
Admission: RE | Admit: 2011-12-24 | Discharge: 2011-12-24 | Disposition: A | Discharge status: Home / Self Care | Payer: Federal, State, Local not specified - PPO | Source: Ambulatory Visit | Attending: Obstetrics and Gynecology | Admitting: Obstetrics and Gynecology

## 2011-12-24 DIAGNOSIS — Z01419 Encounter for gynecological examination (general) (routine) without abnormal findings: Secondary | ICD-10-CM | POA: Insufficient documentation

## 2012-01-09 ENCOUNTER — Encounter (HOSPITAL_COMMUNITY)
Admission: RE | Admit: 2012-01-09 | Discharge: 2012-01-09 | Disposition: A | Discharge status: Home / Self Care | Payer: Federal, State, Local not specified - PPO | Source: Ambulatory Visit | Attending: Obstetrics and Gynecology | Admitting: Obstetrics and Gynecology

## 2012-01-09 DIAGNOSIS — O923 Agalactia: Secondary | ICD-10-CM | POA: Insufficient documentation

## 2012-02-09 ENCOUNTER — Encounter (HOSPITAL_COMMUNITY)
Admission: RE | Admit: 2012-02-09 | Discharge: 2012-02-09 | Disposition: A | Discharge status: Home / Self Care | Payer: Federal, State, Local not specified - PPO | Source: Ambulatory Visit | Attending: Obstetrics and Gynecology | Admitting: Obstetrics and Gynecology

## 2012-02-09 DIAGNOSIS — O923 Agalactia: Secondary | ICD-10-CM | POA: Insufficient documentation

## 2012-02-18 ENCOUNTER — Ambulatory Visit (INDEPENDENT_AMBULATORY_CARE_PROVIDER_SITE_OTHER): Payer: Federal, State, Local not specified - PPO | Admitting: Emergency Medicine

## 2012-02-18 VITALS — BP 117/76 | HR 75 | Temp 98.5°F | Resp 20 | Ht 64.0 in | Wt 246.0 lb

## 2012-02-18 DIAGNOSIS — Z0289 Encounter for other administrative examinations: Secondary | ICD-10-CM

## 2012-02-18 DIAGNOSIS — Z201 Contact with and (suspected) exposure to tuberculosis: Secondary | ICD-10-CM

## 2012-02-18 DIAGNOSIS — Z23 Encounter for immunization: Secondary | ICD-10-CM

## 2012-02-18 NOTE — Progress Notes (Signed)
  Tuberculosis Risk Questionnaire  1. Were you born outside the [place] in one of the following parts of the world:    [place], [place], [place], [place] or [place]?  No  2. Have you traveled outside the [place] and lived for more than one month in one of the following parts of the world:  [place], [place], [place], [place] or [place]?  No  3. Do you have a compromised immune system such as from any of the following conditions:  HIV/AIDS, organ or bone marrow transplantation, diabetes, immunosuppressive   medicines (e.g. Prednisone, Remicaide), leukemia, lymphoma, cancer of the   head or neck, gastrectomy or jejunal bypass, end-stage renal disease (on   dialysis), or silicosis?  No    4. Have you ever done one of the following:    Used crack cocaine, injected illegal drugs, worked or resided in jail or prison,   worked or resided at a homeless shelter, or worked as a Research scientist (physical sciences) in   direct contact with patients?  No  5. Have you ever been exposed to anyone with infectious tuberculosis?  Yes    Tuberculosis Symptom Questionnaire  Do you currently have any of the following symptoms?  1. Unexplained cough lasting more than 3 weeks? No  Unexplained fever lasting more than 3 weeks. No   3. Night Sweats (sweating that leaves the bedclothes and sheets wet)   No  4. Shortness of Breath No  5. Chest Pain Yes   6. Unintentional weight loss  No  7. Unexplained fatigue (very tired for no reason) No

## 2012-02-18 NOTE — Progress Notes (Signed)
  Subjective:    Patient ID: Sydney Dominguez, female    DOB: December 29, 1990, 21 y.o.   MRN: [id number]  HPI  For school physical for health care students  Review of Systems  As per HPI, otherwise negative.      Objective:   Physical Exam  GEN: WDWN, NAD, Non-toxic, A & O x 3 HEENT: Atraumatic, Normocephalic. Neck supple. No masses, No LAD. Ears and Nose: No external deformity. CV: RRR, No M/G/R. No JVD. No thrill. No extra heart sounds. PULM: CTA B, no wheezes, crackles, rhonchi. No retractions. No resp. distress. No accessory muscle use. ABD: S, NT, ND, +BS. No rebound. No HSM. EXTR: No c/c/e NEURO Normal gait.  PSYCH: Normally interactive. Conversant. Not depressed or anxious appearing.  Calm demeanor.        Assessment & Plan:   Physical Forms completed Given TDAP  And flu Refused HEP B TB Screening completed

## 2012-02-21 LAB — QUANTIFERON TB GOLD ASSAY (BLOOD): Interferon Gamma Release Assay: NEGATIVE

## 2012-02-28 ENCOUNTER — Telehealth: Payer: Self-pay

## 2012-02-28 NOTE — Telephone Encounter (Signed)
Pt called to ask about her lab results from 02/18/12. Dr Dareen Piano, can you please review the results and comment so that we can call the pt back?

## 2012-02-28 NOTE — Telephone Encounter (Signed)
Gave pt results and made copy for her to p/up.

## 2012-02-28 NOTE — Telephone Encounter (Signed)
She is immune to varicella and has a negative TB test.  Please let her know

## 2012-03-11 ENCOUNTER — Encounter (HOSPITAL_COMMUNITY)
Admission: RE | Admit: 2012-03-11 | Discharge: 2012-03-11 | Disposition: A | Discharge status: Home / Self Care | Payer: Federal, State, Local not specified - PPO | Source: Ambulatory Visit | Attending: Obstetrics and Gynecology | Admitting: Obstetrics and Gynecology

## 2012-03-11 DIAGNOSIS — O923 Agalactia: Secondary | ICD-10-CM | POA: Insufficient documentation

## 2012-03-28 ENCOUNTER — Ambulatory Visit: Payer: Federal, State, Local not specified - PPO | Admitting: Internal Medicine

## 2012-04-10 ENCOUNTER — Encounter (HOSPITAL_COMMUNITY)
Admission: RE | Admit: 2012-04-10 | Discharge: 2012-04-10 | Disposition: A | Discharge status: Home / Self Care | Payer: Federal, State, Local not specified - PPO | Source: Ambulatory Visit | Attending: Obstetrics and Gynecology | Admitting: Obstetrics and Gynecology

## 2012-04-10 DIAGNOSIS — O923 Agalactia: Secondary | ICD-10-CM | POA: Insufficient documentation

## 2012-04-16 ENCOUNTER — Encounter (HOSPITAL_BASED_OUTPATIENT_CLINIC_OR_DEPARTMENT_OTHER): Payer: Self-pay | Admitting: *Deleted

## 2012-04-16 ENCOUNTER — Emergency Department (HOSPITAL_BASED_OUTPATIENT_CLINIC_OR_DEPARTMENT_OTHER)
Admission: EM | Admit: 2012-04-16 | Discharge: 2012-04-16 | Disposition: A | Discharge status: Home / Self Care | Payer: Federal, State, Local not specified - PPO | Attending: Emergency Medicine | Admitting: Emergency Medicine

## 2012-04-16 DIAGNOSIS — Z79899 Other long term (current) drug therapy: Secondary | ICD-10-CM | POA: Insufficient documentation

## 2012-04-16 DIAGNOSIS — L509 Urticaria, unspecified: Secondary | ICD-10-CM | POA: Insufficient documentation

## 2012-04-16 DIAGNOSIS — E119 Type 2 diabetes mellitus without complications: Secondary | ICD-10-CM | POA: Insufficient documentation

## 2012-04-16 DIAGNOSIS — J45909 Unspecified asthma, uncomplicated: Secondary | ICD-10-CM | POA: Insufficient documentation

## 2012-04-16 DIAGNOSIS — Z3202 Encounter for pregnancy test, result negative: Secondary | ICD-10-CM | POA: Insufficient documentation

## 2012-04-16 MED ORDER — FAMOTIDINE 20 MG PO TABS
20.0000 mg | ORAL_TABLET | Freq: Once | ORAL | Status: AC
  Administered 2012-04-16: 20 mg via ORAL
  Filled 2012-04-16: qty 1

## 2012-04-16 MED ORDER — PREDNISONE 50 MG PO TABS
60.0000 mg | ORAL_TABLET | Freq: Once | ORAL | Status: AC
  Administered 2012-04-16: 60 mg via ORAL
  Filled 2012-04-16: qty 1

## 2012-04-16 MED ORDER — PREDNISONE 50 MG PO TABS: 50.0000 mg | ORAL_TABLET | Freq: Every day | ORAL | Status: DC

## 2012-04-16 MED ORDER — FAMOTIDINE 20 MG PO TABS: 20.0000 mg | ORAL_TABLET | Freq: Two times a day (BID) | ORAL | Status: DC

## 2012-04-16 NOTE — ED Notes (Addendum)
Pt states she has been breaking out with a rash off and on x 3 weeks. Small red bumps to body. +itching. No known triggers.

## 2012-04-16 NOTE — ED Provider Notes (Signed)
History     CSN: [id number]  Arrival date & time 04/16/12  0018   First MD Initiated Contact with Patient 04/16/12 0202      Chief Complaint  Patient presents with  . Rash    (Consider location/radiation/quality/duration/timing/severity/associated sxs/prior treatment) Patient is a 21 y.o. female presenting with rash. The history is provided by the patient. No language interpreter was used.  Rash  This is a new problem. The current episode started more than 1 week ago (More than 3 weeks). The problem has not changed since onset.Associated with: unable to state. There has been no fever. Affected Location: BUE. The pain is at a severity of 0/10. Associated symptoms include itching. Pertinent negatives include no pain and no weeping. She has tried anti-itch cream for the symptoms. The treatment provided no relief. Risk factors: asked about all exposures but unable to state.    Past Medical History  Diagnosis Date  . Asthma   . Diabetes mellitus without complication   . Vertigo     Past Surgical History  Procedure Date  . Hand surgery     l hand    Family History  Problem Relation Age of Onset  . Anesthesia problems Neg Hx   . Hypertension Father   . Hypertension Maternal Grandmother   . Cancer Maternal Grandfather     prostate    History  Substance Use Topics  . Smoking status: Never Smoker   . Smokeless tobacco: Never Used  . Alcohol Use: No    OB History    Grav Para Term Preterm Abortions TAB SAB Ect Mult Living   1 1 1  0 0 0 0 0 0 1      Review of Systems  HENT: Negative for facial swelling.   Respiratory: Negative for shortness of breath and wheezing.   Skin: Positive for itching and rash.  All other systems reviewed and are negative.    Allergies  Review of patient's allergies indicates no known allergies.  Home Medications   Current Outpatient Rx  Name  Route  Sig  Dispense  Refill  . ASCORBIC ACID 1000 MG PO TABS   Oral   Take 1,000 mg by  mouth daily.         . IRON 66 MG PO TABS   Oral   Take 66 mg by mouth daily.         Marland Kitchen MECLIZINE HCL 25 MG PO TABS   Oral   Take 25 mg by mouth 3 (three) times daily as needed.         Marland Kitchen PRENATAL COMPLETE PO   Oral   Take by mouth.           BP 122/78  Pulse 87  Temp 97.7 F (36.5 C) (Oral)  Resp 20  Ht 5' 3.5" (1.613 m)  Wt 245 lb (111.131 kg)  BMI 42.72 kg/m2  SpO2 96%  LMP 02/18/2012  Breastfeeding? Yes  Physical Exam  Constitutional: She is oriented to person, place, and time. She appears well-developed and well-nourished. No distress.  HENT:  Head: Normocephalic and atraumatic.  Mouth/Throat: Oropharynx is clear and moist.       No swelling of the lips tongue or uvula  Eyes: Conjunctivae normal are normal. Pupils are equal, round, and reactive to light.  Neck: Normal range of motion. Neck supple.  Cardiovascular: Normal rate, regular rhythm and intact distal pulses.   Pulmonary/Chest: Effort normal and breath sounds normal. No stridor.  Abdominal: Soft. Bowel  sounds are normal. There is no tenderness. There is no rebound and no guarding.  Musculoskeletal: Normal range of motion.  Neurological: She is alert and oriented to person, place, and time.  Skin: Skin is warm and dry.       Hives B medial upper arms  Psychiatric: She has a normal mood and affect.    ED Course  Procedures (including critical care time)   Labs Reviewed  PREGNANCY, URINE   No results found.   No diagnosis found.    MDM  Unknown source urticaria.  Follow up with family doctor for ongoing care will prescribe pepcid and steroids.  Return for shortness of breath or swelling of the lips tongue or uvula       Sherah Lund K Costas Sena-Rasch, MD 04/16/12 [phone]

## 2012-05-11 ENCOUNTER — Encounter (HOSPITAL_COMMUNITY)
Admission: RE | Admit: 2012-05-11 | Discharge: 2012-05-11 | Disposition: A | Discharge status: Home / Self Care | Payer: Federal, State, Local not specified - PPO | Source: Ambulatory Visit | Attending: Obstetrics and Gynecology | Admitting: Obstetrics and Gynecology

## 2012-05-11 DIAGNOSIS — O923 Agalactia: Secondary | ICD-10-CM | POA: Insufficient documentation

## 2012-06-01 NOTE — Progress Notes (Signed)
Reviewed and agree.

## 2012-06-11 ENCOUNTER — Encounter (HOSPITAL_COMMUNITY)
Admission: RE | Admit: 2012-06-11 | Discharge: 2012-06-11 | Disposition: A | Discharge status: Home / Self Care | Payer: Federal, State, Local not specified - PPO | Source: Ambulatory Visit | Attending: Obstetrics and Gynecology | Admitting: Obstetrics and Gynecology

## 2012-06-11 DIAGNOSIS — O923 Agalactia: Secondary | ICD-10-CM | POA: Insufficient documentation

## 2012-07-10 ENCOUNTER — Encounter (HOSPITAL_COMMUNITY)
Admission: RE | Admit: 2012-07-10 | Discharge: 2012-07-10 | Disposition: A | Discharge status: Home / Self Care | Payer: Federal, State, Local not specified - PPO | Source: Ambulatory Visit | Attending: Obstetrics and Gynecology | Admitting: Obstetrics and Gynecology

## 2012-07-10 DIAGNOSIS — O923 Agalactia: Secondary | ICD-10-CM | POA: Insufficient documentation

## 2012-08-10 ENCOUNTER — Encounter (HOSPITAL_COMMUNITY)
Admission: RE | Admit: 2012-08-10 | Discharge: 2012-08-10 | Disposition: A | Discharge status: Home / Self Care | Payer: Federal, State, Local not specified - PPO | Source: Ambulatory Visit | Attending: Obstetrics and Gynecology | Admitting: Obstetrics and Gynecology

## 2012-08-10 DIAGNOSIS — O923 Agalactia: Secondary | ICD-10-CM | POA: Insufficient documentation

## 2012-09-10 ENCOUNTER — Encounter (HOSPITAL_COMMUNITY)
Admission: RE | Admit: 2012-09-10 | Discharge: 2012-09-10 | Disposition: A | Discharge status: Home / Self Care | Payer: Federal, State, Local not specified - PPO | Source: Ambulatory Visit | Attending: Obstetrics and Gynecology | Admitting: Obstetrics and Gynecology

## 2012-09-10 DIAGNOSIS — O923 Agalactia: Secondary | ICD-10-CM | POA: Insufficient documentation

## 2012-09-20 ENCOUNTER — Emergency Department (HOSPITAL_BASED_OUTPATIENT_CLINIC_OR_DEPARTMENT_OTHER)
Admission: EM | Admit: 2012-09-20 | Discharge: 2012-09-20 | Disposition: A | Discharge status: Home / Self Care | Payer: Federal, State, Local not specified - PPO | Attending: Emergency Medicine | Admitting: Emergency Medicine

## 2012-09-20 ENCOUNTER — Emergency Department (HOSPITAL_BASED_OUTPATIENT_CLINIC_OR_DEPARTMENT_OTHER): Payer: Federal, State, Local not specified - PPO

## 2012-09-20 ENCOUNTER — Encounter (HOSPITAL_BASED_OUTPATIENT_CLINIC_OR_DEPARTMENT_OTHER): Payer: Self-pay

## 2012-09-20 DIAGNOSIS — J45909 Unspecified asthma, uncomplicated: Secondary | ICD-10-CM | POA: Insufficient documentation

## 2012-09-20 DIAGNOSIS — R209 Unspecified disturbances of skin sensation: Secondary | ICD-10-CM | POA: Insufficient documentation

## 2012-09-20 DIAGNOSIS — F411 Generalized anxiety disorder: Secondary | ICD-10-CM | POA: Insufficient documentation

## 2012-09-20 DIAGNOSIS — R002 Palpitations: Secondary | ICD-10-CM | POA: Insufficient documentation

## 2012-09-20 DIAGNOSIS — Z79899 Other long term (current) drug therapy: Secondary | ICD-10-CM | POA: Insufficient documentation

## 2012-09-20 DIAGNOSIS — E119 Type 2 diabetes mellitus without complications: Secondary | ICD-10-CM | POA: Insufficient documentation

## 2012-09-20 DIAGNOSIS — R42 Dizziness and giddiness: Secondary | ICD-10-CM | POA: Insufficient documentation

## 2012-09-20 LAB — COMPREHENSIVE METABOLIC PANEL
AST: 29 U/L (ref 0–37)
Albumin: 3.8 g/dL (ref 3.5–5.2)
Alkaline Phosphatase: 72 U/L (ref 39–117)
BUN: 11 mg/dL (ref 6–23)
Potassium: 3.5 mEq/L (ref 3.5–5.1)
Total Protein: 7.7 g/dL (ref 6.0–8.3)

## 2012-09-20 LAB — CBC WITH DIFFERENTIAL/PLATELET
Basophils Absolute: 0 10*3/uL (ref 0.0–0.1)
Basophils Relative: 0 % (ref 0–1)
Eosinophils Absolute: 0.1 10*3/uL (ref 0.0–0.7)
MCH: 28.4 pg (ref 26.0–34.0)
MCHC: 34.2 g/dL (ref 30.0–36.0)
Monocytes Relative: 7 % (ref 3–12)
Neutrophils Relative %: 68 % (ref 43–77)
Platelets: 371 10*3/uL (ref 150–400)
RDW: 15.1 % (ref 11.5–15.5)

## 2012-09-20 MED ORDER — MECLIZINE HCL 25 MG PO TABS
25.0000 mg | ORAL_TABLET | Freq: Once | ORAL | Status: AC
  Administered 2012-09-20: 25 mg via ORAL
  Filled 2012-09-20: qty 1

## 2012-09-20 MED ORDER — MECLIZINE HCL 25 MG PO TABS: 25.0000 mg | ORAL_TABLET | Freq: Four times a day (QID) | ORAL | Status: DC

## 2012-09-20 NOTE — ED Provider Notes (Signed)
History     CSN: [id number]  Arrival date & time 09/20/12  1313   First MD Initiated Contact with Patient 09/20/12 1326      Chief Complaint  Patient presents with  . Palpitations  . Numbness    (Consider location/radiation/quality/duration/timing/severity/associated sxs/prior treatment) HPI Comments: 22 y.o. Female with PMHx of asthma, DM, and vertigo presents complaining of sudden onset palpitations that have occurred intermittently for a few months. Pt states she does notice the palpitations more at night during the day. She feels "like her heart is racing." There is no associated pain. Denies fever, nausea, vomiting, visual disturbance, headaches.   Pt has not seen her primary care about this, but has an appointment in next 10 days. Review of records show pt has been here for similar sx in the past with no cardiac etiology.   Patient is a 22 y.o. female presenting with palpitations. The history is provided by the patient.  Palpitations Palpitations quality:  Fast Onset quality:  Gradual (off and on for several months) Duration: on and off for hours, more at night. Timing:  Intermittent Progression:  Unchanged Chronicity:  Recurrent Context: anxiety   Context: not exercise and not hyperventilation   Relieved by:  Nothing (better during the day, doesn't usually notice them) Worsened by:  Nothing tried Ineffective treatments:  None tried Associated symptoms: numbness   Associated symptoms: no back pain, no chest pain, no diaphoresis, no dizziness, no nausea, no shortness of breath and no vomiting     Past Medical History  Diagnosis Date  . Asthma   . Diabetes mellitus without complication   . Vertigo     Past Surgical History  Procedure Laterality Date  . Hand surgery      l hand    Family History  Problem Relation Age of Onset  . Anesthesia problems Neg Hx   . Hypertension Father   . Hypertension Maternal Grandmother   . Cancer Maternal Grandfather    prostate    History  Substance Use Topics  . Smoking status: Never Smoker   . Smokeless tobacco: Never Used  . Alcohol Use: No    OB History   Grav Para Term Preterm Abortions TAB SAB Ect Mult Living   1 1 1  0 0 0 0 0 0 1      Review of Systems  Constitutional: Negative for fever and diaphoresis.  HENT: Negative for neck pain and neck stiffness.   Eyes: Negative for visual disturbance.  Respiratory: Negative for apnea, chest tightness and shortness of breath.   Cardiovascular: Positive for palpitations. Negative for chest pain and leg swelling.  Gastrointestinal: Negative for nausea, vomiting, diarrhea and constipation.  Genitourinary: Negative for dysuria.  Musculoskeletal: Negative for back pain and gait problem.  Skin: Negative for rash.  Neurological: Positive for numbness. Negative for dizziness, weakness, light-headedness and headaches.       One episode early this morning to left side. No focal deficits.   Psychiatric/Behavioral: The patient is not nervous/anxious.     Allergies  Shea butter  Home Medications   Current Outpatient Rx  Name  Route  Sig  Dispense  Refill  . ascorbic acid (VITAMIN C) 1000 MG tablet   Oral   Take 1,000 mg by mouth daily.         . Iron 66 MG TABS   Oral   Take 66 mg by mouth daily.         . Prenatal Vit-Fe Fumarate-FA (PRENATAL COMPLETE PO)  Oral   Take by mouth.         . famotidine (PEPCID) 20 MG tablet   Oral   Take 1 tablet (20 mg total) by mouth 2 (two) times daily.   10 tablet   0   . meclizine (ANTIVERT) 25 MG tablet   Oral   Take 25 mg by mouth 3 (three) times daily as needed.         . predniSONE (DELTASONE) 50 MG tablet   Oral   Take 1 tablet (50 mg total) by mouth daily.   5 tablet   0     BP 137/79  Pulse 104  Temp(Src) 98.8 F (37.1 C) (Oral)  Resp 16  Ht 5\' 3"  (1.6 m)  Wt 247 lb (112.038 kg)  BMI 43.76 kg/m2  SpO2 100%  LMP 08/23/2012  Physical Exam  Nursing note and vitals  reviewed. Constitutional: She is oriented to person, place, and time. She appears well-developed and well-nourished. No distress.  HENT:  Head: Normocephalic and atraumatic.  Eyes: Conjunctivae and EOM are normal.  Neck: Normal range of motion. Neck supple.  No meningeal signs  Cardiovascular: Normal rate, regular rhythm and normal heart sounds.  Exam reveals no gallop and no friction rub.   No murmur heard. Pt not tachycardic on exam. HR 88  Pulmonary/Chest: Effort normal and breath sounds normal. No respiratory distress. She has no wheezes. She has no rales. She exhibits no tenderness.  Abdominal: Soft. Bowel sounds are normal. She exhibits no distension. There is no tenderness. There is no rebound and no guarding.  Musculoskeletal: Normal range of motion. She exhibits no edema and no tenderness.  FROM to upper and lower extremities Full range of motion of C-spine, T-spine or L-spine   Neurological: She is alert and oriented to person, place, and time. No cranial nerve deficit.  Speech is clear and goal oriented, follows commands Sensation normal to light touch and two point discrimination Moves extremities without ataxia, coordination intact Normal gait and balance Normal strength in upper and lower extremities bilaterally including dorsiflexion and plantar flexion, strong and equal grip strength   Skin: Skin is warm and dry. She is not diaphoretic. No erythema.  Psychiatric: She has a normal mood and affect.    ED Course  Procedures (including critical care time)  Date: 09/20/2012  Rate: 92  Rhythm: normal sinus rhythm  QRS Axis: normal  Intervals: normal  ST/T Wave abnormalities: normal  Conduction Disutrbances:none  Narrative Interpretation: normal EKG  Old EKG Reviewed: none available   Labs Reviewed  COMPREHENSIVE METABOLIC PANEL - Abnormal; Notable for the following:    ALT 52 (*)    Total Bilirubin 0.1 (*)    All other components within normal limits  CBC WITH  DIFFERENTIAL   Dg Chest 2 View  09/20/2012   *RADIOLOGY REPORT*  Clinical Data: Chest palpitations.  CHEST - 2 VIEW  Comparison: None.  Findings: The lungs are clear.  There is mild elevation of the right hemidiaphragm relative to the left.  No pneumothorax or pleural fluid.  IMPRESSION: No acute finding.   Original Report Authenticated By: Holley Dexter, M.D.   Filed Vitals:   09/20/12 1332 09/20/12 1513 09/20/12 1514 09/20/12 1515  BP:  105/55 107/88 107/59  Pulse: 104 74 80 88  Temp:      TempSrc:      Resp:      Height:      Weight:      SpO2:  1. Heart palpitations   2. Vertigo       MDM  Pt is NAD with normal neuro exam. Pt feeling better after antivert. Tachycardia has resolved. Numbness and palpitations have resolved. EKG shows NSR with 92 HR. Pt ambulated well, states she is feeling better, ready to go home. Pt is reliable with scheduled follow up with PCP.  At this time there does not appear to be any evidence of an acute emergency medical condition and the patient appears stable for discharge with appropriate outpatient follow up.Diagnosis was discussed with patient who verbalizes understanding and is agreeable to discharge. Pt case discussed with Dr. Manus Gunning who agrees with my plan.    Glade Nurse, PA-C 09/20/12 1621  Glade Nurse, PA-C 09/20/12 2218

## 2012-09-20 NOTE — ED Notes (Signed)
Patient ambulates around RN station without difficulty. 

## 2012-09-20 NOTE — ED Notes (Signed)
Pt states that she woke up at 0100 this morning with palpitations and numbness to the L side of her body.  Pt states that this has gradually gotten worse throughout the day.  Pt ambulates with out difficulty but states that she cannot feel her L foot.

## 2012-09-20 NOTE — ED Notes (Signed)
ED PA at BS 

## 2012-09-20 NOTE — ED Notes (Signed)
Patient preparing for discharge. 

## 2012-09-22 NOTE — ED Provider Notes (Signed)
Medical screening examination/treatment/procedure(s) were performed by non-physician practitioner and as supervising physician I was immediately available for consultation/collaboration.  Gerhard Munch, MD 09/22/12 [phone]

## 2012-10-11 ENCOUNTER — Encounter (HOSPITAL_COMMUNITY)
Admission: RE | Admit: 2012-10-11 | Discharge: 2012-10-11 | Disposition: A | Discharge status: Home / Self Care | Payer: Federal, State, Local not specified - PPO | Source: Ambulatory Visit | Attending: Obstetrics and Gynecology | Admitting: Obstetrics and Gynecology

## 2012-10-11 DIAGNOSIS — O923 Agalactia: Secondary | ICD-10-CM | POA: Insufficient documentation

## 2012-11-10 ENCOUNTER — Encounter (HOSPITAL_COMMUNITY)
Admission: RE | Admit: 2012-11-10 | Discharge: 2012-11-10 | Disposition: A | Discharge status: Home / Self Care | Payer: Federal, State, Local not specified - PPO | Source: Ambulatory Visit | Attending: Obstetrics and Gynecology | Admitting: Obstetrics and Gynecology

## 2012-11-10 DIAGNOSIS — O923 Agalactia: Secondary | ICD-10-CM | POA: Insufficient documentation

## 2012-12-11 ENCOUNTER — Encounter (HOSPITAL_COMMUNITY)
Admission: RE | Admit: 2012-12-11 | Discharge: 2012-12-11 | Disposition: A | Discharge status: Home / Self Care | Payer: Federal, State, Local not specified - PPO | Source: Ambulatory Visit | Attending: Obstetrics and Gynecology | Admitting: Obstetrics and Gynecology

## 2012-12-11 DIAGNOSIS — O923 Agalactia: Secondary | ICD-10-CM | POA: Insufficient documentation

## 2013-01-11 ENCOUNTER — Encounter (HOSPITAL_COMMUNITY)
Admission: RE | Admit: 2013-01-11 | Discharge: 2013-01-11 | Disposition: A | Discharge status: Home / Self Care | Payer: Federal, State, Local not specified - PPO | Source: Ambulatory Visit | Attending: Obstetrics and Gynecology | Admitting: Obstetrics and Gynecology

## 2013-01-11 DIAGNOSIS — O923 Agalactia: Secondary | ICD-10-CM | POA: Insufficient documentation

## 2013-03-26 ENCOUNTER — Emergency Department (HOSPITAL_BASED_OUTPATIENT_CLINIC_OR_DEPARTMENT_OTHER)
Admission: EM | Admit: 2013-03-26 | Discharge: 2013-03-26 | Disposition: A | Discharge status: Home / Self Care | Payer: Federal, State, Local not specified - PPO | Attending: Emergency Medicine | Admitting: Emergency Medicine

## 2013-03-26 ENCOUNTER — Encounter (HOSPITAL_BASED_OUTPATIENT_CLINIC_OR_DEPARTMENT_OTHER): Payer: Self-pay | Admitting: Emergency Medicine

## 2013-03-26 DIAGNOSIS — J029 Acute pharyngitis, unspecified: Secondary | ICD-10-CM | POA: Insufficient documentation

## 2013-03-26 DIAGNOSIS — Z792 Long term (current) use of antibiotics: Secondary | ICD-10-CM | POA: Insufficient documentation

## 2013-03-26 DIAGNOSIS — H60399 Other infective otitis externa, unspecified ear: Secondary | ICD-10-CM | POA: Insufficient documentation

## 2013-03-26 DIAGNOSIS — H9210 Otorrhea, unspecified ear: Secondary | ICD-10-CM | POA: Insufficient documentation

## 2013-03-26 DIAGNOSIS — Z79899 Other long term (current) drug therapy: Secondary | ICD-10-CM | POA: Insufficient documentation

## 2013-03-26 DIAGNOSIS — R509 Fever, unspecified: Secondary | ICD-10-CM | POA: Insufficient documentation

## 2013-03-26 DIAGNOSIS — J45909 Unspecified asthma, uncomplicated: Secondary | ICD-10-CM | POA: Insufficient documentation

## 2013-03-26 DIAGNOSIS — H9209 Otalgia, unspecified ear: Secondary | ICD-10-CM | POA: Insufficient documentation

## 2013-03-26 DIAGNOSIS — H6091 Unspecified otitis externa, right ear: Secondary | ICD-10-CM

## 2013-03-26 DIAGNOSIS — R110 Nausea: Secondary | ICD-10-CM | POA: Insufficient documentation

## 2013-03-26 LAB — RAPID STREP SCREEN (MED CTR MEBANE ONLY): Streptococcus, Group A Screen (Direct): NEGATIVE

## 2013-03-26 MED ORDER — HYDROCODONE-ACETAMINOPHEN 7.5-325 MG/15ML PO SOLN: 15.0000 mL | Freq: Four times a day (QID) | ORAL | Status: DC | PRN

## 2013-03-26 MED ORDER — AMOXICILLIN 400 MG/5ML PO SUSR: 800.0000 mg | Freq: Two times a day (BID) | ORAL | Status: AC

## 2013-03-26 MED ORDER — CIPROFLOXACIN-DEXAMETHASONE 0.3-0.1 % OT SUSP: 4.0000 [drp] | Freq: Two times a day (BID) | OTIC | Status: AC

## 2013-03-26 NOTE — ED Notes (Signed)
Sore throat and pain in her right ear for over a week.

## 2013-03-26 NOTE — ED Provider Notes (Signed)
CSN: [id number]     Arrival date & time 03/26/13  1642 History  This chart was scribed for [employer]. Rubin Payor, MD by Blanchard Kelch, ED Scribe. The patient was seen in room MH02/MH02. Patient's care was started at 4:55 PM.    Chief Complaint  Patient presents with  . Sore Throat  . Otalgia    Patient is a 22 y.o. female presenting with pharyngitis and ear pain. The history is provided by the patient. No language interpreter was used.  Sore Throat  Otalgia Associated symptoms: fever and sore throat   Associated symptoms: no cough     HPI Comments: Sydney Dominguez is a 22 y.o. female who presents to the Emergency Department complaining of constant right sided sore throat and right ear pain for four days. She has had associated subjective fevers and nausea with the pain. The pain is worsened with swallowing and is making it difficult to swallow. She denies sick contacts. She denies any chance she could be pregnant. She denies a history of recurrent throat infections. She denies cough.    Past Medical History  Diagnosis Date  . Asthma   . Vertigo    Past Surgical History  Procedure Laterality Date  . Hand surgery      l hand   Family History  Problem Relation Age of Onset  . Anesthesia problems Neg Hx   . Hypertension Father   . Hypertension Maternal Grandmother   . Cancer Maternal Grandfather     prostate   History  Substance Use Topics  . Smoking status: Never Smoker   . Smokeless tobacco: Never Used  . Alcohol Use: No   OB History   Grav Para Term Preterm Abortions TAB SAB Ect Mult Living   1 1 1  0 0 0 0 0 0 1     Review of Systems  Constitutional: Positive for fever.  HENT: Positive for ear pain, sore throat and trouble swallowing.   Respiratory: Negative for cough.   Gastrointestinal: Positive for nausea.  All other systems reviewed and are negative.    Allergies  Shea butter  Home Medications   Current Outpatient Rx  Name  Route  Sig  Dispense   Refill  . amoxicillin (AMOXIL) 400 MG/5ML suspension   Oral   Take 10 mLs (800 mg total) by mouth 2 (two) times daily.   200 mL   0   . ascorbic acid (VITAMIN C) 1000 MG tablet   Oral   Take 1,000 mg by mouth daily.         . ciprofloxacin-dexamethasone (CIPRODEX) otic suspension   Right Ear   Place 4 drops into the right ear 2 (two) times daily.   7.5 mL   0   . famotidine (PEPCID) 20 MG tablet   Oral   Take 1 tablet (20 mg total) by mouth 2 (two) times daily.   10 tablet   0   . HYDROcodone-acetaminophen (HYCET) 7.5-325 mg/15 ml solution   Oral   Take 15 mLs by mouth every 6 (six) hours as needed for moderate pain.   120 mL   0   . Iron 66 MG TABS   Oral   Take 66 mg by mouth daily.         . meclizine (ANTIVERT) 25 MG tablet   Oral   Take 25 mg by mouth 3 (three) times daily as needed.         . meclizine (ANTIVERT) 25 MG tablet  Oral   Take 1 tablet (25 mg total) by mouth 4 (four) times daily.   28 tablet   0   . predniSONE (DELTASONE) 50 MG tablet   Oral   Take 1 tablet (50 mg total) by mouth daily.   5 tablet   0   . Prenatal Vit-Fe Fumarate-FA (PRENATAL COMPLETE PO)   Oral   Take by mouth.          Triage Vitals: BP 132/75  Pulse 80  Temp(Src) 98 F (36.7 C) (Oral)  Resp 18  Ht 5\' 3"  (1.6 m)  Wt 249 lb (112.946 kg)  BMI 44.12 kg/m2  SpO2 100%  LMP 03/12/2013  Physical Exam  Nursing note and vitals reviewed. Constitutional: She is oriented to person, place, and time. She appears well-developed and well-nourished.  HENT:  Right Ear: Tympanic membrane normal. There is drainage.  Mouth/Throat: Uvula is midline. Oropharyngeal exudate present.  Right external canal edema and drainage. Posterior pharynx erythematous. Bilateral swollen tonsils, greater on right. Uvula midline. Right tonsil touches uvula but no tonsilar kissing.  Neck: Normal range of motion. Neck supple. No tracheal tenderness present.  Cardiovascular: Normal rate and  regular rhythm.   No murmur heard. Pulmonary/Chest: Effort normal and breath sounds normal. No stridor. No respiratory distress. She has no wheezes. She has no rales.  Lymphadenopathy:    She has cervical adenopathy.  Neurological: She is alert and oriented to person, place, and time.  Skin: Skin is warm and dry.  Psychiatric: She has a normal mood and affect. Her behavior is normal.    ED Course  Procedures (including critical care time)    COORDINATION OF CARE: 5:02 PM -Will order antibiotics.  Patient verbalizes understanding and agrees with treatment plan.    Labs Review Labs Reviewed  RAPID STREP SCREEN  CULTURE, GROUP A STREP   Imaging Review No results found.  EKG Interpretation   None       MDM   1. Pharyngitis   2. Otitis externa, right    Patient with pharyngitis and fever. We'll treat as strep. Also has an otitis externa on the right. We'll treat with drops. Also given symptomatic treatment.  I personally performed the services described in this documentation, which was scribed in my presence. The recorded information has been reviewed and is accurate.     Juliet Rude. Rubin Payor, MD 03/26/13 2340

## 2013-03-28 LAB — CULTURE, GROUP A STREP

## 2014-02-18 ENCOUNTER — Encounter (HOSPITAL_BASED_OUTPATIENT_CLINIC_OR_DEPARTMENT_OTHER): Payer: Self-pay | Admitting: Emergency Medicine

## 2014-02-21 ENCOUNTER — Ambulatory Visit: Payer: Federal, State, Local not specified - PPO | Admitting: Obstetrics

## 2014-08-08 ENCOUNTER — Ambulatory Visit: Payer: Federal, State, Local not specified - PPO | Admitting: Obstetrics

## 2014-08-26 ENCOUNTER — Ambulatory Visit (INDEPENDENT_AMBULATORY_CARE_PROVIDER_SITE_OTHER): Payer: Federal, State, Local not specified - PPO | Admitting: Obstetrics

## 2014-08-26 ENCOUNTER — Encounter: Payer: Self-pay | Admitting: Obstetrics

## 2014-08-26 VITALS — BP 119/78 | HR 78 | Temp 98.0°F | Ht 64.0 in | Wt 268.0 lb

## 2014-08-26 DIAGNOSIS — Z124 Encounter for screening for malignant neoplasm of cervix: Secondary | ICD-10-CM | POA: Diagnosis not present

## 2014-08-26 DIAGNOSIS — M549 Dorsalgia, unspecified: Secondary | ICD-10-CM | POA: Diagnosis not present

## 2014-08-26 DIAGNOSIS — E669 Obesity, unspecified: Secondary | ICD-10-CM

## 2014-08-26 DIAGNOSIS — N939 Abnormal uterine and vaginal bleeding, unspecified: Secondary | ICD-10-CM

## 2014-08-26 DIAGNOSIS — Z01419 Encounter for gynecological examination (general) (routine) without abnormal findings: Secondary | ICD-10-CM | POA: Diagnosis not present

## 2014-08-26 LAB — POCT URINE PREGNANCY: Preg Test, Ur: NEGATIVE

## 2014-08-26 NOTE — Progress Notes (Signed)
Subjective:        Sydney Dominguez is a 24 y.o. female here for a routine exam.  Current complaints: Irregular cycles.  Back pain and shoulder pain for several years and worse since childbirth 3 years ago.  Personal health questionnaire:  Is patient Sydney Dominguez, have a family history of breast and/or ovarian cancer: no Is there a family history of uterine cancer diagnosed at age < 6850, gastrointestinal cancer, urinary tract cancer, family member who is a Personnel officerLynch syndrome-associated carrier: no Is the patient overweight and hypertensive, family history of diabetes, personal history of gestational diabetes, preeclampsia or PCOS: no Is patient over 4255, have PCOS,  family history of premature CHD under age 25, diabetes, smoke, have hypertension or peripheral artery disease:  no At any time, has a partner hit, kicked or otherwise hurt or frightened you?: no Over the past 2 weeks, have you felt down, depressed or hopeless?: no Over the past 2 weeks, have you felt little interest or pleasure in doing things?:no   Gynecologic History Patient's last menstrual period was 07/27/2014 (approximate). Contraception: condoms Last Pap: 2015. Results were: normal Last mammogram: n/a. Results were: n/a  Obstetric History OB History  Gravida Para Term Preterm AB SAB TAB Ectopic Multiple Living  1 1 1  0 0 0 0 0 0 1    # Outcome Date GA Lbr Len/2nd Weight Sex Delivery Anes PTL Lv  1 Term 06/05/11 [id number] 13:30 / 02:43 8 lb 4.9 oz (3.768 kg) F Vag-Spont EPI  Y     Comments: LOA       Past Medical History  Diagnosis Date  . Asthma   . Vertigo     Past Surgical History  Procedure Laterality Date  . Hand surgery      l hand     Current outpatient prescriptions:  .  ascorbic acid (VITAMIN C) 1000 MG tablet, Take 1,000 mg by mouth daily., Disp: , Rfl:  .  Multiple Vitamins-Minerals (WOMENS MULTIVITAMIN PLUS PO), Take by mouth., Disp: , Rfl:  No Active Allergies  History  Substance Use  Topics  . Smoking status: Never Smoker   . Smokeless tobacco: Never Used  . Alcohol Use: No    Family History  Problem Relation Age of Onset  . Anesthesia problems Neg Hx   . Hypertension Father   . Hypertension Maternal Grandmother   . Cancer Maternal Grandfather     prostate      Review of Systems  Constitutional: negative for fatigue and weight loss.  Positive for obesity. Respiratory: negative for cough and wheezing Cardiovascular: negative for chest pain, fatigue and palpitations Gastrointestinal: negative for abdominal pain and change in bowel habits Musculoskeletal:positive  for myalgias of shoulders and back from heavy breasts Neurological: negative for gait problems and tremors Behavioral/Psych: negative for abusive relationship, depression Endocrine: negative for temperature intolerance   Genitourinary:negative for abnormal menstrual periods, genital lesions, hot flashes, sexual problems and vaginal discharge Integument/breast: negative for breast lump, breast tenderness, nipple discharge and skin lesion(s)    Objective:       BP 119/78 mmHg  Pulse 78  Temp(Src) 98 F (36.7 C)  Ht 5\' 4"  (1.626 m)  Wt 268 lb (121.564 kg)  BMI 45.98 kg/m2  LMP 07/27/2014 (Approximate) General:   alert  Skin:   no rash or abnormalities  Lungs:   clear to auscultation bilaterally  Heart:   regular rate and rhythm, S1, S2 normal, no murmur, click, rub or gallop  Breasts:  normal but large and pendulous, without suspicious masses, skin or nipple changes or axillary nodes  Abdomen:  normal findings: obese, no organomegaly, soft, non-tender and no hernia  Pelvis:  External genitalia: normal general appearance Urinary system: urethral meatus normal and bladder without fullness, nontender Vaginal: normal without tenderness, induration or masses Cervix: normal appearance Adnexa: normal bimanual exam Uterus: anteverted and non-tender, normal size   Lab Review Urine pregnancy  test Labs reviewed yes Radiologic studies reviewed yes   Assessment:    Healthy female exam.    Obesity  Large and pendulous breasts  Back and shoulder pain from heavy breasts   Plan:    Education reviewed: depression evaluation, low fat, low cholesterol diet, safe sex/STD prevention, self breast exams, weight bearing exercise and benefits of breast reduction surgery. Contraception: condoms. Follow up in: 1 year.  Recommended high quality well supportive bras for relief of strain on shoulders and back.  Plastic Surgery consultation recommended for consideration of breast reduction. Weight loss program recommended  Meds ordered this encounter  Medications  . Multiple Vitamins-Minerals (WOMENS MULTIVITAMIN PLUS PO)    Sig: Take by mouth.   Orders Placed This Encounter  Procedures  . SureSwab, Vaginosis/Vaginitis Plus  . US Pelvis Complete    Standing Status: Future     Number of Occurrences:      Standing Expiration Date: 10/26/2015    Order Specific Question:  Reason for Exam (SYMPTOM  OR DIAGNOSIS REQUIRED)    Answer:  AUB    Order Specific Question:  Preferred imaging location?    Answer:  Internal  . US Transvaginal Non-OB    Standing Status: Future     Number of Occurrences:      Standing Expiration Date: 10/26/2015    Order Specific Question:  Reason for Exam (SYMPTOM  OR DIAGNOSIS REQUIRED)    Answer:  AUB    Order Specific Question:  Preferred imaging location?    Answer:  Internal  . HIV antibody  . Hepatitis B surface antigen  . RPR  . Hepatitis C antibody  . Ambulatory referral to Internal Medicine    Referral Priority:  Routine    Referral Type:  Consultation    Referral Reason:  Specialty Services Required    Requested Specialty:  Internal Medicine    Number of Visits Requested:  1  . POCT urine pregnancy

## 2014-08-27 LAB — PAP IG W/ RFLX HPV ASCU

## 2014-08-27 LAB — HIV ANTIBODY (ROUTINE TESTING W REFLEX): HIV 1&2 Ab, 4th Generation: NONREACTIVE

## 2014-08-27 LAB — HEPATITIS B SURFACE ANTIGEN: Hepatitis B Surface Ag: NEGATIVE

## 2014-08-27 LAB — SYPHILIS: RPR W/REFLEX TO RPR TITER AND TREPONEMAL ANTIBODIES, TRADITIONAL SCREENING AND DIAGNOSIS ALGORITHM

## 2014-08-27 LAB — HEPATITIS C ANTIBODY: HCV Ab: NEGATIVE

## 2014-08-30 LAB — SURESWAB, VAGINOSIS/VAGINITIS PLUS
ATOPOBIUM VAGINAE: 7.2 Log (cells/mL)
C. ALBICANS, DNA: NOT DETECTED
C. GLABRATA, DNA: NOT DETECTED
C. TRACHOMATIS RNA, TMA: NOT DETECTED
C. parapsilosis, DNA: NOT DETECTED
C. tropicalis, DNA: NOT DETECTED
LACTOBACILLUS SPECIES: NOT DETECTED Log (cells/mL)
MEGASPHAERA SPECIES: 7.9 Log (cells/mL)
N. GONORRHOEAE RNA, TMA: NOT DETECTED
T. VAGINALIS RNA, QL TMA: NOT DETECTED

## 2014-09-05 ENCOUNTER — Ambulatory Visit: Payer: Federal, State, Local not specified - PPO | Admitting: Obstetrics

## 2014-09-05 ENCOUNTER — Other Ambulatory Visit: Payer: Self-pay | Admitting: Obstetrics

## 2014-09-05 ENCOUNTER — Other Ambulatory Visit: Payer: Federal, State, Local not specified - PPO

## 2014-09-05 DIAGNOSIS — N760 Acute vaginitis: Principal | ICD-10-CM

## 2014-09-05 DIAGNOSIS — B9689 Other specified bacterial agents as the cause of diseases classified elsewhere: Secondary | ICD-10-CM

## 2014-09-05 MED ORDER — TINIDAZOLE 500 MG PO TABS: 1000.0000 mg | ORAL_TABLET | Freq: Every day | ORAL | Status: DC

## 2014-09-12 ENCOUNTER — Ambulatory Visit: Payer: Federal, State, Local not specified - PPO | Admitting: Obstetrics

## 2014-09-12 ENCOUNTER — Other Ambulatory Visit: Payer: Federal, State, Local not specified - PPO

## 2014-11-06 ENCOUNTER — Other Ambulatory Visit: Payer: Federal, State, Local not specified - PPO

## 2014-11-19 ENCOUNTER — Other Ambulatory Visit (INDEPENDENT_AMBULATORY_CARE_PROVIDER_SITE_OTHER): Payer: Federal, State, Local not specified - PPO

## 2014-11-19 VITALS — BP 125/79 | HR 79 | Temp 98.3°F | Ht 64.0 in | Wt 268.6 lb

## 2014-11-19 DIAGNOSIS — Z0000 Encounter for general adult medical examination without abnormal findings: Secondary | ICD-10-CM | POA: Diagnosis not present

## 2014-11-19 LAB — POCT URINE PREGNANCY: Preg Test, Ur: NEGATIVE

## 2014-11-20 ENCOUNTER — Ambulatory Visit (INDEPENDENT_AMBULATORY_CARE_PROVIDER_SITE_OTHER): Payer: Federal, State, Local not specified - PPO | Admitting: Obstetrics

## 2014-11-20 ENCOUNTER — Encounter: Payer: Self-pay | Admitting: Obstetrics

## 2014-11-20 VITALS — BP 115/73 | HR 79 | Temp 98.7°F | Ht 65.0 in | Wt 267.0 lb

## 2014-11-20 DIAGNOSIS — N912 Amenorrhea, unspecified: Secondary | ICD-10-CM

## 2014-11-20 DIAGNOSIS — E669 Obesity, unspecified: Secondary | ICD-10-CM

## 2014-11-20 NOTE — Progress Notes (Signed)
Patient ID: Sydney Dominguez, female   DOB: Nov 03, 1990, 24 y.o.   MRN: [id number]  Chief Complaint  Patient presents with  . Menstrual Problem    Missed cycle.     HPI Sydney Dominguez is a 24 y.o. female.  Missed period.  Contraception - condoms.  Normally periods are regular.  Has positive breast tenderness, fatigue and urinary frequency.  Has had some mild cramping and spotting during the month of July. HPI  Past Medical History  Diagnosis Date  . Asthma   . Vertigo     Past Surgical History  Procedure Laterality Date  . Hand surgery      l hand    Family History  Problem Relation Age of Onset  . Anesthesia problems Neg Hx   . Hypertension Father   . Hypertension Maternal Grandmother   . Cancer Maternal Grandfather     prostate    Social History History  Substance Use Topics  . Smoking status: Never Smoker   . Smokeless tobacco: Never Used  . Alcohol Use: No    No Known Allergies  Current Outpatient Prescriptions  Medication Sig Dispense Refill  . ascorbic acid (VITAMIN C) 1000 MG tablet Take 1,000 mg by mouth daily.    . Multiple Vitamins-Minerals (WOMENS MULTIVITAMIN PLUS PO) Take by mouth.    . tinidazole (TINDAMAX) 500 MG tablet Take 2 tablets (1,000 mg total) by mouth daily with breakfast. (Patient not taking: Reported on 11/20/2014) 10 tablet 2   No current facility-administered medications for this visit.    Review of Systems Review of Systems Constitutional: negative for fatigue and weight loss Respiratory: negative for cough and wheezing Cardiovascular: negative for chest pain, fatigue and palpitations Gastrointestinal: negative for abdominal pain and change in bowel habits Genitourinary: missed period Integument/breast: negative for nipple discharge Musculoskeletal:negative for myalgias Neurological: negative for gait problems and tremors Behavioral/Psych: negative for abusive relationship, depression Endocrine: negative for temperature  intolerance     Blood pressure 115/73, pulse 79, temperature 98.7 F (37.1 C), height  (1.651 m), weight 267 lb (121.11 kg), last menstrual period 10/11/2014.  Physical Exam Physical Exam General:   alert  Skin:   no rash or abnormalities  Lungs:   clear to auscultation bilaterally  Heart:   regular rate and rhythm, S1, S2 normal, no murmur, click, rub or gallop  Breasts:   normal without suspicious masses, skin or nipple changes or axillary nodes  Abdomen:  normal findings: no organomegaly, soft, non-tender and no hernia  Pelvis:  External genitalia: normal general appearance Urinary system: urethral meatus normal and bladder without fullness, nontender Vaginal: normal without tenderness, induration or masses Cervix: normal appearance Adnexa: normal bimanual exam Uterus: anteverted and non-tender, normal size      Data Reviewed Urine pregnancy test.  Assessment     Missed period.     Plan    Conservative management.  Will wait and watch for next period.  Repeat UCG if no period. Considering OCP's for contraception and cycle regulation. Also considering ParaGuard IUD.  No orders of the defined types were placed in this encounter.   No orders of the defined types were placed in this encounter.

## 2014-11-20 NOTE — Addendum Note (Signed)
Addended by: Henriette Combs on: 11/20/2014 01:14 PM   Modules accepted: Orders

## 2014-11-23 ENCOUNTER — Other Ambulatory Visit: Payer: Self-pay | Admitting: Obstetrics

## 2014-11-23 DIAGNOSIS — B3731 Acute candidiasis of vulva and vagina: Secondary | ICD-10-CM

## 2014-11-23 DIAGNOSIS — B373 Candidiasis of vulva and vagina: Secondary | ICD-10-CM

## 2014-11-23 LAB — SURESWAB, VAGINOSIS/VAGINITIS PLUS
Atopobium vaginae: NOT DETECTED Log (cells/mL)
C. TRACHOMATIS RNA, TMA: NOT DETECTED
C. albicans, DNA: NOT DETECTED
C. glabrata, DNA: NOT DETECTED
C. parapsilosis, DNA: DETECTED — AB
C. tropicalis, DNA: NOT DETECTED
Gardnerella vaginalis: <4.7 Log (cells/mL)
LACTOBACILLUS SPECIES: NOT DETECTED Log (cells/mL)
MEGASPHAERA SPECIES: NOT DETECTED Log (cells/mL)
N. gonorrhoeae RNA, TMA: NOT DETECTED
T. vaginalis RNA, QL TMA: NOT DETECTED

## 2014-11-23 MED ORDER — FLUCONAZOLE 150 MG PO TABS: 150.0000 mg | ORAL_TABLET | Freq: Once | ORAL | Status: DC

## 2015-01-01 ENCOUNTER — Ambulatory Visit: Payer: Federal, State, Local not specified - PPO | Admitting: Obstetrics

## 2017-04-28 ENCOUNTER — Other Ambulatory Visit: Payer: Self-pay

## 2017-04-28 ENCOUNTER — Ambulatory Visit (INDEPENDENT_AMBULATORY_CARE_PROVIDER_SITE_OTHER)
Payer: No Typology Code available for payment source | Admitting: Student in an Organized Health Care Education/Training Program

## 2017-04-28 ENCOUNTER — Encounter: Payer: Self-pay | Admitting: Student in an Organized Health Care Education/Training Program

## 2017-04-28 VITALS — BP 110/74 | HR 66 | Temp 98.6°F | Ht 64.0 in | Wt 260.0 lb

## 2017-04-28 DIAGNOSIS — Z7689 Persons encountering health services in other specified circumstances: Secondary | ICD-10-CM | POA: Diagnosis not present

## 2017-04-28 DIAGNOSIS — F319 Bipolar disorder, unspecified: Secondary | ICD-10-CM

## 2017-04-28 DIAGNOSIS — F3130 Bipolar disorder, current episode depressed, mild or moderate severity, unspecified: Secondary | ICD-10-CM | POA: Diagnosis not present

## 2017-04-28 MED ORDER — LAMOTRIGINE 50 MG PO TBDP: 50.0000 mg | ORAL_TABLET | Freq: Every day | ORAL | 0 refills | Status: DC

## 2017-04-28 MED ORDER — LAMOTRIGINE 25 MG PO TABS: 25.0000 mg | ORAL_TABLET | Freq: Every day | ORAL | 0 refills | Status: DC

## 2017-04-28 NOTE — Progress Notes (Signed)
CC: New Patient, Depression  HPI: Sydney EatonHasheema Stillion is a 27 y.o. female who [facility] care as a [facility] patient. She endorses depression which she would like to discuss today.  Depression/Mood symptoms Patient reports that she's had depressive symptoms which have persisted for about one year.  - She does endorse increased sleeping, decreased interest in things she used to like doing such as going shopping.  - She does endorse decreased energy.   - She endorses decreased concentration. - She endorses increased appetite and feels she has binge eating issues - She denies thoughts of hurting herself or others - She does endorse having periods of 2-3 days when she does not have to sleep - She does endorse having periods of time when she is told by friends that she speaks rapidly, does not make sense - She endorses having times when friends point out her mood seems very elevated - [facility] and told she may have bipolar depression - She has never been medicated for her symptoms in the past - She feels her symptoms interfere with her daily functioning including taking care of her 28-year-old daughter.  PMH: Lat pap 2015  PSH: Patient lives alone. Designates Father to make medical decisions if she was unable to.  Denies alcohol, drug or tobacco use.    Family Hx: Mother: Depression/Anxiety Father: Depression/Anxiety Sister: HLD, HTN  Social Hx:  Lives alone at home with 28 year old daughter  Allergies: None  Meds: Tumeric 3 ml/day  Review of Symptoms:  See HPI for ROS.   CC, SH/smoking status, and VS noted.   Objective: BP 110/74 (BP Location: Left Arm, Patient Position: Sitting, Cuff Size: Large)   Pulse 66   Temp 98.6 F (37 C) (Oral)   Ht 5\' 4"  (1.626 m)   Wt 260 lb (117.9 kg)   SpO2 99%   BMI 44.63 kg/m  GEN: NAD, alert, cooperative, and pleasant. EYE: no conjunctival injection, pupils equally round and reactive  to light ENMT: normal tympanic light reflex, no nasal polyps,no rhinorrhea, no pharyngeal erythema or exudates NECK: full ROM, no thyromegaly RESPIRATORY: clear to auscultation bilaterally with no wheezes, rhonchi or rales, good effort CV: RRR, no m/r/g, no peripheral edema GI: soft, non-tender, non-distended, no hepatosplenomegaly SKIN: warm and dry, no rashes or lesions NEURO: II-XII grossly intact, normal gait PSYCH: AAOx3, appropriate affect  SCREENING PHQ-9 today is 12. MDQ is pan positive, score indicates patient should be evaluated for bipolar disorder  Depression screen [facility]/9 04/28/2017  Decreased Interest 2  Down, Depressed, Hopeless 1  PHQ - 2 Score 3  Altered sleeping 2  Tired, decreased energy 1  Change in appetite 3  Feeling bad or failure about yourself  0  Trouble concentrating 2  Moving slowly or fidgety/restless 1  Suicidal thoughts 0  PHQ-9 Score 12    Assessment and plan:  Bipolar depression (HCC) Based on history, patient may meet DSM-IV criteria for bipolar 2 disorder though it is not 100% that she meets diagnostic criteria for hypomanic episodes in the past by history. Currently in a depressive phase.   Upon review of DSM5 criteria, evidence for possible hypomanic episodes for this pt in the past include: - Distinct periods of abnormally elevated mood lasting ~3-4 days - During periods of increased mood she endorses having decreased need for sleep, distractibility, more talkative than usual. - Unclear whether the episode is associated with change in functioning that  is uncharacteristic of the patient when she is  Asymptomatic - She does endorse that the change in mood is observable by others - She does endorse that the change in mood is not severe enough to cause marketed impairment on social functioning, has not necessitated hospitalization - She denies medication or substance use that may otherwise explain the changes in mood  Based on this evidence,  as well as her screen positive for depression, I think it is possible that she has depression with bipolar features or Bipolar II disorder. Therefore we will not prescribe an antidepressant today so as not to precipitate a manic episode. - Started Lamictal,  25 mg daily 14 days, through 1/23. - On 1/24, plan to start 50 mg daily 14 days - Patient to follow-up in 2 weeks - Titration plan as follows: Initial: Weeks 1 and 2: 25 mg once daily; Weeks 3 and 4: 50 mg once daily; Week 5: 100 mg once daily; Week 6 and maintenance: 200 mg once daily - Patient advised to follow-up immediately if she develops a rash or sores in her mouth - Patient was given the card of Dr. Pascal Lux to schedule a behavioral health visit     Meds ordered this encounter  Medications  . LamoTRIgine 50 MG TBDP    Sig: Take 1 tablet (50 mg total) by mouth daily for 14 days.    Dispense:  14 tablet    Refill:  0  . lamoTRIgine (LAMICTAL) 25 MG tablet    Sig: Take 1 tablet (25 mg total) by mouth daily for 14 days.    Dispense:  14 tablet    Refill:  0   Howard Pouch, MD,MS,  PGY2 04/28/2017 12:13 PM

## 2017-04-28 NOTE — Patient Instructions (Addendum)
It was a pleasure seeing you today in our clinic. Today we discussed your depression symptoms. Here is the treatment plan we have discussed and agreed upon together:   Start the 25 mg daily dose of Lamictal once daily for the next 14 days.  On 1/24, Start the 50 mg dose of Lamictal, which I sent to your pharmacy.  Please schedule follow up to be seen in 2 weeks.  If you develop skin rash or sores in your mouth these would be reasons to call our office or come in to be seen right away.  Our clinic's number is [phone]. Please call with questions or concerns about what we discussed today.  Be well, Dr. Mosetta PuttFeng

## 2017-04-28 NOTE — Assessment & Plan Note (Addendum)
Based on history, patient may meet DSM-IV criteria for bipolar 2 disorder though it is not 100% that she meets diagnostic criteria for hypomanic episodes in the past by history. Currently in a depressive phase.   Upon review of DSM5 criteria, evidence for possible hypomanic episodes for this pt in the past include: - Distinct periods of abnormally elevated mood lasting ~3-4 days - During periods of increased mood she endorses having decreased need for sleep, distractibility, more talkative than usual. - Unclear whether the episode is associated with change in functioning that is uncharacteristic of the patient when she is  Asymptomatic - She does endorse that the change in mood is observable by others - She does endorse that the change in mood is not severe enough to cause marketed impairment on social functioning, has not necessitated hospitalization - She denies medication or substance use that may otherwise explain the changes in mood  Based on this evidence, as well as her screen positive for depression, I think it is possible that she has depression with bipolar features or Bipolar II disorder. Therefore we will not prescribe an antidepressant today so as not to precipitate a manic episode. - Started Lamictal,  25 mg daily 14 days, through 1/23. - On 1/24, plan to start 50 mg daily 14 days - Patient to follow-up in 2 weeks - Titration plan as follows: Initial: Weeks 1 and 2: 25 mg once daily; Weeks 3 and 4: 50 mg once daily; Week 5: 100 mg once daily; Week 6 and maintenance: 200 mg once daily - Patient advised to follow-up immediately if she develops a rash or sores in her mouth - Patient was given the card of Dr. Pascal LuxKane to schedule a behavioral health visit

## 2017-05-11 ENCOUNTER — Telehealth: Payer: Self-pay | Admitting: Student in an Organized Health Care Education/Training Program

## 2017-05-11 NOTE — Telephone Encounter (Signed)
Needs authorization for Lamictal.  Please call [phone] ([employer] insurance focus plan)  To authorize.  She will be out of her medication tomorrow.

## 2017-05-13 NOTE — Telephone Encounter (Signed)
Pt called again about get the authorization for her Rx done. Please advise

## 2017-05-17 ENCOUNTER — Telehealth: Payer: Self-pay | Admitting: Psychology

## 2017-05-17 ENCOUNTER — Encounter: Payer: Self-pay | Admitting: Student in an Organized Health Care Education/Training Program

## 2017-05-17 ENCOUNTER — Other Ambulatory Visit (HOSPITAL_COMMUNITY)
Admission: RE | Admit: 2017-05-17 | Discharge: 2017-05-17 | Disposition: A | Discharge status: Home / Self Care | Payer: No Typology Code available for payment source | Source: Ambulatory Visit | Attending: Family Medicine | Admitting: Family Medicine

## 2017-05-17 ENCOUNTER — Other Ambulatory Visit: Payer: Self-pay

## 2017-05-17 ENCOUNTER — Ambulatory Visit (INDEPENDENT_AMBULATORY_CARE_PROVIDER_SITE_OTHER)
Payer: No Typology Code available for payment source | Admitting: Student in an Organized Health Care Education/Training Program

## 2017-05-17 VITALS — BP 118/60 | HR 82 | Temp 98.2°F | Ht 64.0 in | Wt 261.6 lb

## 2017-05-17 DIAGNOSIS — F3130 Bipolar disorder, current episode depressed, mild or moderate severity, unspecified: Secondary | ICD-10-CM

## 2017-05-17 DIAGNOSIS — F319 Bipolar disorder, unspecified: Secondary | ICD-10-CM

## 2017-05-17 DIAGNOSIS — Z202 Contact with and (suspected) exposure to infections with a predominantly sexual mode of transmission: Secondary | ICD-10-CM

## 2017-05-17 DIAGNOSIS — Z124 Encounter for screening for malignant neoplasm of cervix: Secondary | ICD-10-CM | POA: Diagnosis not present

## 2017-05-17 DIAGNOSIS — Z0000 Encounter for general adult medical examination without abnormal findings: Secondary | ICD-10-CM

## 2017-05-17 DIAGNOSIS — B379 Candidiasis, unspecified: Secondary | ICD-10-CM | POA: Diagnosis not present

## 2017-05-17 LAB — POCT WET PREP (WET MOUNT)
Clue Cells Wet Prep Whiff POC: NEGATIVE
TRICHOMONAS WET PREP HPF POC: ABSENT

## 2017-05-17 MED ORDER — FLUCONAZOLE 150 MG PO TABS: 150.0000 mg | ORAL_TABLET | Freq: Once | ORAL | 0 refills | Status: AC

## 2017-05-17 MED ORDER — LAMOTRIGINE 25 MG PO TABS: 25.0000 mg | ORAL_TABLET | Freq: Every day | ORAL | 0 refills | Status: DC

## 2017-05-17 MED FILL — lamoTRIgine 25 MG TABS: 25 | 21 days supply | Qty: 42 | Fill #0

## 2017-05-17 NOTE — Patient Instructions (Addendum)
It was a pleasure seeing you today in our clinic. Today we discussed your bipolar medication and we did STI screening and PAP. Here is the treatment plan we have discussed and agreed upon together:  We will start your lamictal over at the initial loading dose 25 mg for 14 days. Your next dose will be 2 pills per day, or 50 mg for 14 days.   Please schedule follow up to be seen in 2 weeks.  We drew blood work at today's visit. I will call or send you a letter with these results. If you do not hear from me within the next week, please give our office a call.  Our clinic's number is [phone]. Please call with questions or concerns about what we discussed today.  Be well, Dr. Mosetta PuttFeng

## 2017-05-17 NOTE — Telephone Encounter (Signed)
Patient called to schedule an appointment.  Reviewed Dr. Latanya MaudlinFeng's note.  She was started on a medicine but there was no mention of [facility].  Patient thought she was being referred for therapy.  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  Discussed options including Integrated Care and the [facility] ([facility]).  She opted for that as it will allow for more traditional therapy.    Will let Dr. Mosetta PuttFeng know.  If patient needs to be seen for medication management, I will need to discuss those with Dr. Mosetta PuttFeng directly.

## 2017-05-17 NOTE — Assessment & Plan Note (Signed)
There was yeast on wet prep, and patient endorses pruritis. Treated with diflucan 150 mg x1.

## 2017-05-17 NOTE — Progress Notes (Signed)
CC: bipolar disorder follow up  HPI: Sydney Dominguez is a 27 y.o. female with PMH significant for bipolar depression who presents to [facility] today for follow up  Bipolar Disorder II, subsequent Patient reports completing the 25 mg loading dose x 14  Days without issue. She felt her symptoms were already improving on this dose. She felt like "a weight was lifted," and she was not staying up all night anymore. She had some night terrors for the first 4 days which subsequently resolved.  However, she had difficulty getting the 50 mg dose due to needing a prior auth that was not routed to my inbox. As a result she has been off of the medication x7 days.   PAP/STI screening Patient requests PAP and STI screening today. She reports last PAP was 2016, denies ever having an abnormal pap. She has had white vaginal discharge with mild pruritis, no burning with urination, no abdominal pain. No lesions noted by the patient. She denies being sexually active in the last 6 months.  Review of Symptoms:  See HPI for ROS.   CC, SH/smoking status, and VS noted.  Objective: BP 118/60   Pulse 82   Temp 98.2 F (36.8 C) (Oral)   Ht 5\' 4"  (1.626 m)   Wt 261 lb 9.6 oz (118.7 kg)   LMP 04/15/2017 (Approximate)   SpO2 96%   BMI 44.90 kg/m  GEN: NAD, alert, cooperative, and pleasant. Female genitalia: Vulva: normal appearing vulva with no masses, tenderness or lesions Vagina: normal appearing vagina with normal color and discharge, no lesions Cervix: normal appearing cervix without discharge or lesions Uterus: uterus is normal size, shape, consistency and nontender  Assessment and plan:  Healthcare maintenance Pap test done today.  Full STI workup completed for screening at the request of the patient: wet prep, GC/Chla, RPR, HIV. Will follow up results with the patient.  Bipolar depression (HCC) Patient did well with her first 2 weeks on lamictal, however was unable to get the second dose of hte  medication. We can start the titration over. Titration plan: Initial: Weeks 1 and 2: 25 mg once daily; (2/12) Weeks 3 and 4: 50 mg once daily; Week 5: 100 mg once daily; Week 6 and maintenance: 200 mg once dailyration plan as follows: Initial: Weeks 1 and 2: 25 mg once daily; Weeks 3 and 4: 50 mg once daily; Week 5: 100 mg once daily; Week 6 and maintenance: 200 mg once daily - spoke with pharmacy over the phone and one way around the prior auth is to prescribe 25 mg tab x2 rather than the 50 mg tab. This seems reasonable. Patient was given the first 4 weeks of medicine and advised to follow up in 2 weeks - advised patient that lamotrigine is category C in pregnancy, advised continuing to use contraception.  Yeast infection There was yeast on wet prep, and patient endorses pruritis. Treated with diflucan 150 mg x1.   Orders Placed This Encounter  Procedures  . HIV antibody (with reflex)  . POCT Wet Prep [facility])    Meds ordered this encounter  Medications  . lamoTRIgine (LAMICTAL) 25 MG tablet    Sig: Take 1 tablet (25 mg total) by mouth daily. For the first 14 days, take 1 tablet (25 mg) daily. Next 14 days, take 2 tablets (50 mg) daily.    Dispense:  42 tablet    Refill:  0  . fluconazole (DIFLUCAN) 150 MG tablet    Sig: Take 1  tablet (150 mg total) by mouth once for 1 dose.    Dispense:  1 tablet    Refill:  0    Howard PouchLauren Bertrum Helmstetter, MD,MS,  PGY2 05/17/2017 11:00 AM

## 2017-05-17 NOTE — Assessment & Plan Note (Addendum)
Pap test done today.  Full STI workup completed for screening at the request of the patient: wet prep, GC/Chla, RPR, HIV. Will follow up results with the patient.

## 2017-05-17 NOTE — Assessment & Plan Note (Addendum)
Patient did well with her first 2 weeks on lamictal, however was unable to get the second dose of hte medication. We can start the titration over. Titration plan: Initial: Weeks 1 and 2: 25 mg once daily; (2/12) Weeks 3 and 4: 50 mg once daily; Week 5: 100 mg once daily; Week 6 and maintenance: 200 mg once dailyration plan as follows: Initial: Weeks 1 and 2: 25 mg once daily; Weeks 3 and 4: 50 mg once daily; Week 5: 100 mg once daily; Week 6 and maintenance: 200 mg once daily - spoke with pharmacy over the phone and one way around the prior auth is to prescribe 25 mg tab x2 rather than the 50 mg tab. This seems reasonable. Patient was given the first 4 weeks of medicine and advised to follow up in 2 weeks - advised patient that lamotrigine is category C in pregnancy, advised continuing to use contraception.

## 2017-05-18 LAB — CYTOLOGY - PAP: Diagnosis: NEGATIVE

## 2017-05-18 LAB — CERVICOVAGINAL ANCILLARY ONLY
CHLAMYDIA, DNA PROBE: NEGATIVE
Neisseria Gonorrhea: NEGATIVE

## 2017-05-18 LAB — HIV ANTIBODY (ROUTINE TESTING W REFLEX): HIV SCREEN 4TH GENERATION: NONREACTIVE

## 2017-05-19 ENCOUNTER — Telehealth: Payer: Self-pay | Admitting: Student in an Organized Health Care Education/Training Program

## 2017-05-19 NOTE — Telephone Encounter (Signed)
Pt informed, she requested mailed results.  Placed in mail today. Fleeger, Maryjo RochesterJessica Dawn, CMA

## 2017-05-19 NOTE — Telephone Encounter (Signed)
Pt would like to be called about her results from her appointment on the 29th. Please advise

## 2017-05-19 NOTE — Telephone Encounter (Signed)
Please call and let patient know that her tests including HIV, Wet prep, Gonorrhea/Chlamydia, and PAP were all normal/negative. We will plan to repeat her Pap test in 3 years.

## 2017-05-31 ENCOUNTER — Other Ambulatory Visit: Payer: Self-pay

## 2017-05-31 ENCOUNTER — Ambulatory Visit (INDEPENDENT_AMBULATORY_CARE_PROVIDER_SITE_OTHER)
Payer: No Typology Code available for payment source | Admitting: Student in an Organized Health Care Education/Training Program

## 2017-05-31 ENCOUNTER — Encounter: Payer: Self-pay | Admitting: Student in an Organized Health Care Education/Training Program

## 2017-05-31 VITALS — BP 118/70 | HR 80 | Temp 98.6°F | Ht 64.0 in | Wt 257.0 lb

## 2017-05-31 DIAGNOSIS — F319 Bipolar disorder, unspecified: Secondary | ICD-10-CM

## 2017-05-31 DIAGNOSIS — F3130 Bipolar disorder, current episode depressed, mild or moderate severity, unspecified: Secondary | ICD-10-CM

## 2017-05-31 MED ORDER — LAMOTRIGINE 100 MG PO TABS: 100.0000 mg | ORAL_TABLET | Freq: Every day | ORAL | 0 refills | Status: DC

## 2017-05-31 NOTE — Patient Instructions (Signed)
It was a pleasure seeing you today in our clinic. Here is the treatment plan we have discussed and agreed upon together:  Please continue 50 mg of Lamictal THROUGH 2/25  On 2/26, START taking 100 mg daily for one week.  Please schedule an appointment to be seen BEFORE 3/5.  Our clinic's number is [phone]. Please call with questions or concerns about what we discussed today.  Be well, Dr. Mosetta PuttFeng

## 2017-05-31 NOTE — Progress Notes (Signed)
   CC: bipolar F/U  HPI: Sydney EatonHasheema Dominguez is a 27 y.o. female   Bipolar Disorder II, subsequent Patient reports completing the 25 mg loading dose x 14 days of lamictal without issue. She is now ready to move on to 50 mg daily x14 days. She reports that her symptoms are improving on this dose. She endorses improvement in concentrating, anxiety. She has had no difficulty sleeping through the night. She denies impulsive behavior or spending. She does endorse side effect of dry mouth which she finds tolerable, no sores in her mouth. She feels she does not have the high highs or low lows anymore and her mood has already started leveling out.  Review of Symptoms:  See HPI for ROS.   CC, SH/smoking status, and VS noted.  Objective: BP 118/70   Pulse 80   Temp 98.6 F (37 C) (Oral)   Ht 5\' 4"  (1.626 m)   Wt 257 lb (116.6 kg)   SpO2 100%   BMI 44.11 kg/m  GEN: NAD, alert, cooperative, and pleasant. EYE: no conjunctival injection, pupils equally round and reactive to light ENMT: no MMM lesions or sores NECK: full ROM, no thyromegaly RESPIRATORY: clear to auscultation bilaterally with no wheezes, rhonchi or rales, good effort CVI: soft, non-tender, non-distended, no hepatosplenomegaly SKIN: warm and dry, no rashes or lesions: RRR, no m/r/g, no peripheral edema G NEURO: II-XII grossly intact, normal gait, peripheral sensation intact PSYCH: AAOx3, appropriate affect  GAD-7 score: 6 PHQ.9 score: 11 - no HI/SI   Assessment and plan:  Bipolar depression (HCC) Patient is tolerating lamictal well and has seen improvement in her symptoms. Titration plan: Initial: Weeks 1 and 2: 25 mg once daily; (2/12) Weeks 3 and 4: 50 mg once daily (2/12-2/25); Week 5: 100 mg once daily (through 3/4); Week 6 and maintenance: 200 mg once daily - advised patient that lamotrigine is category C in pregnancy, advised continuing to use contraception. - Asked patient to schedule a follow up before 3/4 so we can  check in and then escalate therapy to maintenance dose    Meds ordered this encounter  Medications  . lamoTRIgine (LAMICTAL) 100 MG tablet    Sig: Take 1 tablet (100 mg total) by mouth daily for 7 days.    Dispense:  7 tablet    Refill:  0    Howard PouchLauren Kimya Mccahill, MD,MS,  PGY2 06/02/2017 3:35 PM

## 2017-06-02 NOTE — Assessment & Plan Note (Signed)
Patient is tolerating lamictal well and has seen improvement in her symptoms. Titration plan: Initial: Weeks 1 and 2: 25 mg once daily; (2/12) Weeks 3 and 4: 50 mg once daily (2/12-2/25); Week 5: 100 mg once daily (through 3/4); Week 6 and maintenance: 200 mg once daily - advised patient that lamotrigine is category C in pregnancy, advised continuing to use contraception. - Asked patient to schedule a follow up before 3/4 so we can check in and then escalate therapy to maintenance dose

## 2017-06-13 MED FILL — lamoTRIgine 50 MG TBDP: 50 | 14 days supply | Qty: 14 | Fill #0

## 2017-06-17 ENCOUNTER — Ambulatory Visit
Payer: No Typology Code available for payment source | Admitting: Student in an Organized Health Care Education/Training Program

## 2017-06-21 ENCOUNTER — Telehealth: Payer: Self-pay | Admitting: Student in an Organized Health Care Education/Training Program

## 2017-06-21 ENCOUNTER — Encounter: Payer: Self-pay | Admitting: Student in an Organized Health Care Education/Training Program

## 2017-06-21 ENCOUNTER — Other Ambulatory Visit: Payer: Self-pay | Admitting: Student in an Organized Health Care Education/Training Program

## 2017-06-21 DIAGNOSIS — F319 Bipolar disorder, unspecified: Secondary | ICD-10-CM

## 2017-06-21 MED ORDER — LAMOTRIGINE 25 MG PO TABS: 50.0000 mg | ORAL_TABLET | Freq: Every day | ORAL | 0 refills | Status: DC

## 2017-06-21 MED ORDER — LAMOTRIGINE 100 MG PO TABS: 100.0000 mg | ORAL_TABLET | Freq: Every day | ORAL | 0 refills | Status: DC

## 2017-06-21 NOTE — Progress Notes (Signed)
Patient calls for refill on lamictal. States she completes her 2 weeks at the 50 mg dose on Friday. Ordered 4 tablets for 2 more days of lamictal 50 mg daily. She then transitions to 100 mg dose x7 days. She expresses understanding of this plan. She has follow up scheduled 3/13.

## 2017-06-22 NOTE — Telephone Encounter (Signed)
error 

## 2017-06-29 ENCOUNTER — Encounter: Payer: Self-pay | Admitting: Student in an Organized Health Care Education/Training Program

## 2017-06-29 ENCOUNTER — Ambulatory Visit (INDEPENDENT_AMBULATORY_CARE_PROVIDER_SITE_OTHER)
Payer: No Typology Code available for payment source | Admitting: Student in an Organized Health Care Education/Training Program

## 2017-06-29 ENCOUNTER — Other Ambulatory Visit: Payer: Self-pay

## 2017-06-29 DIAGNOSIS — F3130 Bipolar disorder, current episode depressed, mild or moderate severity, unspecified: Secondary | ICD-10-CM

## 2017-06-29 DIAGNOSIS — R632 Polyphagia: Secondary | ICD-10-CM | POA: Diagnosis not present

## 2017-06-29 DIAGNOSIS — F319 Bipolar disorder, unspecified: Secondary | ICD-10-CM

## 2017-06-29 MED ORDER — LAMOTRIGINE 200 MG PO TABS: 200.0000 mg | ORAL_TABLET | Freq: Every day | ORAL | 0 refills | Status: DC

## 2017-06-29 MED FILL — lamoTRIgine 200 MG TABS: 200 | 90 days supply | Qty: 90 | Fill #0

## 2017-06-29 NOTE — Assessment & Plan Note (Signed)
See binge eating plan

## 2017-06-29 NOTE — Progress Notes (Signed)
CC: bipolar disorder  HPI: Sydney Dominguez is a 27 y.o. female with PMH significant for bipolar disorder [facility] up lamictal titration.  Bipolar Disorder II, subsequent Patient has been gradually titrated up on lamictal with frequent visits over the last couple of months. She is currently on her week at the 100 mg dose and will be ready to start the 200 mg maintenance dose on Friday 3/15.  Patient reports that she has been tolerating Lamictal well.  She denies having any pressured speech or rapid thoughts. She reports that her family has commented on her ability to engage in conversations better, and she seems to have less anxiety. She denies feelings of depression. She denies any rashes or lesions in her mouth. She understands lamictal is category C for pregnancy and endorses barrier method for contraception, plans to follow up for contraception counseling.  Severe obesity and history of binge eating Patient has history of severe obesity and endorses binge eating behavior. She has been offered behavioral health and nutrition referral on multiple occasion and continues to decline. She feels she over eats when she is excited, sad, or by herself. She reports that she has spoken with nutrition and done group therapy in the past and feels she would like to speak to bariatric surgery next. She is open to trying medical nutrition therapy to start.  Review of Symptoms:  See HPI for ROS.   CC, SH/smoking status, and VS noted.  Objective: BP 120/64   Pulse (!) 101   Temp 98.3 F (36.8 C) (Oral)   Ht 5\' 4"  (1.626 m)   Wt 261 lb 8 oz (118.6 kg)   LMP 06/20/2017   SpO2 98%   BMI 44.89 kg/m  GEN: NAD, alert, cooperative, and pleasant. NEURO: II-XII grossly intact, normal gait, peripheral sensation intact PSYCH: AAOx3, appropriate affect  GAD 7 : Generalized Anxiety Score 06/29/2017  Nervous, Anxious, on Edge 1  Control/stop worrying 0  Worry too much - different  things 0  Trouble relaxing 1  Easily annoyed or irritable 0  Afraid - awful might happen 0   [facility] 06/29/2017 04/28/2017  Decreased Interest 0 2  Down, Depressed, Hopeless 0 1  PHQ - 2 Score 0 3  Altered sleeping 0 2  Tired, decreased energy 0 1  Change in appetite 1 3  Feeling bad or failure about yourself  0 0  Trouble concentrating 0 2  Moving slowly or fidgety/restless 1 1  Suicidal thoughts 0 0  PHQ-9 Score 2 12  Difficult doing work/chores Not difficult at all -    Assessment and plan:  Binge eating BMI 44. Has tried nutrition and group therapy in the past per patient. Interested in bariatric surgery. She is agreeable to looking into [facility] ([phone]) for information.  - attempt referral to [facility] for medical weight loss management - if patient is not agreeable to this method after her initial information session we can go ahead and refer to bariatric surgery - she declines nutrition or behavioral health intervention at this time.  Bipolar depression (HCC) Patient currently titrating dose for bipolar disorder. Overall symptoms have improved on this medication. - Start lamictal 200 mg, this is the maintenance dose - no red flags on history or exam - discussed contraception with patient, lamictal is category C for pregnancy. She is using barrier method for now and will follow up after giving contraception more thought.   Severe obesity (BMI >=  40) (HCC) See binge eating plan   Orders Placed This Encounter  Procedures  . Amb ref to Medical Nutrition Therapy-[facility]    Referral Priority:   Routine    Referral Type:   Consultation    Referral Reason:   Specialty Services Required    Requested Specialty:   Nutrition    Number of Visits Requested:   1    Meds ordered this encounter  Medications  . lamoTRIgine (LAMICTAL) 200 MG tablet    Sig: Take 1 tablet (200 mg total) by mouth daily.    Dispense:  90 tablet    Refill:  0    Howard Pouch, MD,MS,  PGY2 06/29/2017 4:10 PM

## 2017-06-29 NOTE — Assessment & Plan Note (Signed)
Patient currently titrating dose for bipolar disorder. Overall symptoms have improved on this medication. - Start lamictal 200 mg, this is the maintenance dose - no red flags on history or exam - discussed contraception with patient, lamictal is category C for pregnancy. She is using barrier method for now and will follow up after giving contraception more thought.

## 2017-06-29 NOTE — Assessment & Plan Note (Addendum)
BMI 44. Has tried nutrition and group therapy in the past per patient. Interested in bariatric surgery. She is agreeable to looking into [facility] ([phone]) for information.  - attempt referral to [facility] for medical weight loss management - if patient is not agreeable to this method after her initial information session we can go ahead and refer to bariatric surgery - she declines nutrition or behavioral health intervention at this time.

## 2017-06-30 NOTE — Patient Instructions (Signed)
Epic was down and therefore patient did not receive an AVS for this visit.

## 2017-09-06 ENCOUNTER — Encounter (INDEPENDENT_AMBULATORY_CARE_PROVIDER_SITE_OTHER): Payer: Self-pay

## 2017-09-20 ENCOUNTER — Encounter (INDEPENDENT_AMBULATORY_CARE_PROVIDER_SITE_OTHER): Payer: Self-pay | Admitting: Family Medicine

## 2017-09-20 ENCOUNTER — Ambulatory Visit (INDEPENDENT_AMBULATORY_CARE_PROVIDER_SITE_OTHER): Payer: No Typology Code available for payment source | Admitting: Family Medicine

## 2017-09-20 VITALS — BP 119/75 | HR 63 | Temp 98.0°F | Ht 63.0 in | Wt 254.0 lb

## 2017-09-20 DIAGNOSIS — Z1331 Encounter for screening for depression: Secondary | ICD-10-CM | POA: Diagnosis not present

## 2017-09-20 DIAGNOSIS — F3181 Bipolar II disorder: Secondary | ICD-10-CM | POA: Diagnosis not present

## 2017-09-20 DIAGNOSIS — R7303 Prediabetes: Secondary | ICD-10-CM | POA: Diagnosis not present

## 2017-09-20 DIAGNOSIS — Z9189 Other specified personal risk factors, not elsewhere classified: Secondary | ICD-10-CM

## 2017-09-20 DIAGNOSIS — R5383 Other fatigue: Secondary | ICD-10-CM

## 2017-09-20 DIAGNOSIS — E559 Vitamin D deficiency, unspecified: Secondary | ICD-10-CM | POA: Diagnosis not present

## 2017-09-20 DIAGNOSIS — Z0289 Encounter for other administrative examinations: Secondary | ICD-10-CM

## 2017-09-20 DIAGNOSIS — R0602 Shortness of breath: Secondary | ICD-10-CM

## 2017-09-20 DIAGNOSIS — Z6841 Body Mass Index (BMI) 40.0 and over, adult: Secondary | ICD-10-CM

## 2017-09-20 NOTE — Progress Notes (Signed)
Office: [phone]  /  Fax: [phone]   Dear Dr. Mosetta PuttFeng,   Thank you for referring Sydney Dominguez to our clinic. The following note includes my evaluation and treatment recommendations.  HPI:   Chief Complaint: OBESITY    Sydney Dominguez has been referred by Howard PouchLauren Feng, MD for consultation regarding her obesity and obesity related comorbidities.    Sydney Dominguez (MR# [id number]) is a 27 y.o. female who presents on 09/20/2017 for obesity evaluation and treatment. Current BMI is Body mass index is 44.99 kg/m.Marland Kitchen. Sydney Dominguez has been struggling with her weight for many years and has been unsuccessful in either losing weight, maintaining weight loss, or reaching her healthy weight goal. Patient is considering weight loss surgery, but wants to try medial weight loss first.     Sydney Dominguez attended our information session and states she is currently in the action stage of change and ready to dedicate time achieving and maintaining a healthier weight. Sydney Dominguez is interested in becoming our patient and working on intensive lifestyle modifications including (but not limited to) diet, exercise and weight loss.    Sydney Dominguez states she thinks her family will eat healthier with  her she struggles with family and or coworkers weight loss sabotage her desired weight loss is 69 lbs she started gaining weight after childbirth her heaviest weight ever was 195 lbs. she has significant food cravings issues  she snacks frequently in the evenings she wakes up frequently in the middle of the night to eat she skips meals frequently she is trying to eat vegetarian she is frequently drinking liquids with calories she frequently makes poor food choices she has problems with excessive hunger  she frequently eats larger portions than normal  she has binge eating behaviors she struggles with emotional eating    Fatigue Sydney Dominguez feels her energy is lower than it should be. This has worsened with weight gain and  has not worsened recently. Sydney Dominguez admits to daytime somnolence and  admits to waking up still tired. Patient is at risk for obstructive sleep apnea. Patent has a history of symptoms of daytime fatigue and morning fatigue. Patient generally gets 8 or 9 hours of sleep per night, and states they generally have restful sleep. Snoring is present. Apneic episodes are present. Epworth Sleepiness Score is 14  Dyspnea on exertion Sydney Dominguez notes increasing shortness of breath with exercising and seems to be worsening over time with weight gain. She notes getting out of breath sooner with activity than she used to. This has not gotten worse recently. Sydney Dominguez denies orthopnea.  Pre-Diabetes Sydney Dominguez has a diagnosis of prediabetes. She has a history of gestational diabetes with her 67six year old daughter. She was informed this puts her at greater risk of developing diabetes. Sydney Dominguez is not taking metformin currently and she is attempting to work on diet and exercise to decrease risk of diabetes. She admits polyphagia and denies nausea or hypoglycemia.  At risk for diabetes Sydney Dominguez is at higher than average risk for developing diabetes due to her obesity and pre-diabetes. She currently admits polyuria or polydipsia.  Vitamin D deficiency Sydney Dominguez has a diagnosis of vitamin D deficiency. There are no recent labs. She is not currently taking vit D. Sydney Dominguez admits fatigue and denies nausea, vomiting or muscle weakness.  Sydney Dominguez Sydney Dominguez has a complex mental health history, including depression, anxiety, Sydney Dominguez and schizophrenia. She is followed by her PCP and is on Lamictal. She is tearful in the office today discussing her emotional eating, which  includes some binge eating and nighttime eating disorder behaviors. Sydney Dominguez struggles with emotional eating and using food for comfort to the extent that it is negatively impacting her health. She often snacks when she is not hungry. Sydney Dominguez  sometimes feels she is out of control and then feels guilty that she made poor food choices.   Depression screen Sydney Dominguez 2/9 09/20/2017 06/29/2017 06/29/2017 05/31/2017 05/17/2017  Decreased Interest 3 0 1 0 1  Down, Depressed, Hopeless 3 0 1 1 1   PHQ - 2 Score 6 0 2 1 2   Altered sleeping 3 0 - - -  Tired, decreased energy 3 0 - - -  Change in appetite 3 1 - - -  Feeling bad or failure about yourself  3 0 - - -  Trouble concentrating 3 0 - - -  Moving slowly or fidgety/restless 3 1 - - -  Suicidal thoughts 1 0 - - -  PHQ-9 Score 25 2 - - -  Difficult doing work/chores Somewhat difficult Not difficult at all - - -      Depression Screen Sydney Dominguez's Food and Mood (modified PHQ-9) score was  Depression screen PHQ 2/9 09/20/2017  Decreased Interest 3  Down, Depressed, Hopeless 3  PHQ - 2 Score 6  Altered sleeping 3  Tired, decreased energy 3  Change in appetite 3  Feeling bad or failure about yourself  3  Trouble concentrating 3  Moving slowly or fidgety/restless 3  Suicidal thoughts 1  PHQ-9 Score 25  Difficult doing work/chores Somewhat difficult    ALLERGIES: No Known Allergies  MEDICATIONS: Current Outpatient Medications on File Prior to Visit  Medication Sig Dispense Refill  . lamoTRIgine (LAMICTAL) 200 MG tablet Take 1 tablet (200 mg total) by mouth daily. 90 tablet 0  . Spirulina POWD 5 mg by Does not apply route 3 (three) times daily.    Marland Kitchen lamoTRIgine (LAMICTAL) 25 MG tablet Take 2 tablets (50 mg total) by mouth daily for 2 days. 4 tablet 0   No current facility-administered medications on file prior to visit.     PAST MEDICAL HISTORY: Past Medical History:  Diagnosis Date  . Anemia   . Anxiety   . Asthma   . B12 deficiency   . Back pain   . Sydney disorder (HCC)   . Chest pain   . Constipation   . Depression   . Dyspnea   . GERD (gastroesophageal reflux disease)   . Palpitations   . Prediabetes   . Schizophrenia (HCC)   . Vertigo   . Vitamin D deficiency       PAST SURGICAL HISTORY: Past Surgical History:  Procedure Laterality Date  . HAND SURGERY     l hand    SOCIAL HISTORY: Social History   Tobacco Use  . Smoking status: Never Smoker  . Smokeless tobacco: Never Used  Substance Use Topics  . Alcohol use: No    Alcohol/week: 0.0 oz  . Drug use: No    FAMILY HISTORY: Family History  Problem Relation Age of Onset  . Diabetes Mother   . Hypertension Mother   . Thyroid disease Mother   . Depression Mother   . Anxiety disorder Mother   . Obesity Mother   . Hypertension Father   . Hyperlipidemia Father   . Depression Father   . Anxiety disorder Father   . Sydney disorder Father   . Schizophrenia Father   . Alcoholism Father   . Eating disorder Father   .  Hypertension Maternal Grandmother   . Cancer Maternal Grandfather        prostate  . Anesthesia problems Neg Hx     ROS: Review of Systems  Constitutional: Positive for chills, fever and malaise/fatigue.       Breasts Pain  HENT: Positive for congestion (nasal stuffiness) and tinnitus.        Dry Mouth Hoarseness  Eyes:       Wear Glasses or Contacts Blurry or Double Vision  Respiratory: Positive for shortness of breath.   Cardiovascular: Positive for palpitations. Negative for orthopnea.       Shortness of Breath with activity Chest Pain or Discomfort Chest Tightness Sudden Awakening from Sleep with Shortness of Breath  Gastrointestinal: Positive for constipation and heartburn. Negative for nausea and vomiting.  Genitourinary: Positive for frequency.  Musculoskeletal: Positive for back pain.       Muscle or Joint Pain Negative for muscle weakness  Skin:       Dryness Hair or Nail changes  Neurological: Positive for dizziness and weakness.  Endo/Heme/Allergies: Positive for polydipsia. Bruises/bleeds easily (bruising).       Positive for polyphagia Negative for hypoglycemia  Psychiatric/Behavioral: Positive for depression. The patient is  nervous/anxious (nervousness) and has insomnia.        Stress     PHYSICAL EXAM: Blood pressure 119/75, pulse 63, temperature 98 F (36.7 C), temperature source Oral, height 5\' 3"  (1.6 m), weight 254 lb (115.2 kg), last menstrual period 08/29/2017, SpO2 100 %. Body mass index is 44.99 kg/m. Physical Exam  Constitutional: She is oriented to person, place, and time. She appears well-developed and well-nourished.  HENT:  Head: Normocephalic and atraumatic.  Nose: Nose normal.  Eyes: EOM are normal. No scleral icterus.  Neck: Normal range of motion. Neck supple. No thyromegaly present.  Cardiovascular: Normal rate and regular rhythm.  Pulmonary/Chest: Effort normal. No respiratory distress.  Abdominal: Soft. There is no tenderness.  + obesity  Musculoskeletal: Normal range of motion.  Range of Motion normal in all 4 extremities  Neurological: She is alert and oriented to person, place, and time. Coordination normal.  Skin: Skin is warm and dry.  Vitals reviewed.   RECENT LABS AND TESTS: BMET    Component Value Date/Time   NA 140 09/20/2012 1400   K 3.5 09/20/2012 1400   CL 104 09/20/2012 1400   CO2 25 09/20/2012 1400   GLUCOSE 94 09/20/2012 1400   BUN 11 09/20/2012 1400   CREATININE 0.70 09/20/2012 1400   CALCIUM 9.8 09/20/2012 1400   GFRNONAA >90 09/20/2012 1400   GFRAA >90 09/20/2012 1400   No results found for: HGBA1C No results found for: INSULIN CBC    Component Value Date/Time   WBC 7.8 09/20/2012 1400   RBC 4.79 09/20/2012 1400   HGB 13.6 09/20/2012 1400   HCT 39.8 09/20/2012 1400   PLT 371 09/20/2012 1400   MCV 83.1 09/20/2012 1400   MCH 28.4 09/20/2012 1400   MCHC 34.2 09/20/2012 1400   RDW 15.1 09/20/2012 1400   LYMPHSABS 1.8 09/20/2012 1400   MONOABS 0.6 09/20/2012 1400   EOSABS 0.1 09/20/2012 1400   BASOSABS 0.0 09/20/2012 1400   Iron/TIBC/Ferritin/ %Sat No results found for: IRON, TIBC, FERRITIN, IRONPCTSAT Lipid Panel  No results found for:  CHOL, TRIG, HDL, CHOLHDL, VLDL, LDLCALC, LDLDIRECT Hepatic Function Panel     Component Value Date/Time   PROT 7.7 09/20/2012 1400   ALBUMIN 3.8 09/20/2012 1400   AST 29 09/20/2012 1400  ALT 52 (H) 09/20/2012 1400   ALKPHOS 72 09/20/2012 1400   BILITOT 0.1 (L) 09/20/2012 1400   No results found for: TSH  ECG  shows NSR with a rate of 65 BPM INDIRECT CALORIMETER done today shows a VO2 of 271 and a REE of 1888.  Her calculated basal metabolic rate is 0272 thus her basal metabolic rate is better than expected.    ASSESSMENT AND PLAN: Other fatigue - Plan: EKG 12-Lead, Vitamin B12, CBC With Differential, Folate, Lipid Panel With LDL/HDL Ratio, T3, T4, free, TSH  Shortness of breath on exertion - Plan: CBC With Differential  Prediabetes - Plan: Comprehensive metabolic panel, Hemoglobin A1c, Insulin, random  Vitamin D deficiency - Plan: VITAMIN D 25 Hydroxy (Vit-D Deficiency, Fractures)  Sydney 2 disorder (HCC)  Depression screening  At risk for diabetes mellitus  Class 3 severe obesity with serious comorbidity and body mass index (BMI) of 45.0 to 49.9 in adult, unspecified obesity type (HCC)  PLAN: Fatigue Sydney Dominguez was informed that her fatigue may be related to obesity, depression or many other causes. Labs will be ordered, and in the meanwhile Sydney Dominguez has agreed to work on diet, exercise and weight loss to help with fatigue. Proper sleep hygiene was discussed including the need for 7-8 hours of quality sleep each night. A sleep study was not ordered based on symptoms and Epworth score.  Dyspnea on exertion Alexzandrea's shortness of breath appears to be obesity related and exercise induced. She has agreed to work on weight loss and gradually increase exercise to treat her exercise induced shortness of breath. If Sydney Dominguez follows our instructions and loses weight without improvement of her shortness of breath, we will plan to refer to pulmonology. We will monitor this condition  regularly. Marieta agrees to this plan.  Pre-Diabetes Sydney Dominguez will continue to work on weight loss, exercise, and decreasing simple carbohydrates in her diet to help decrease the risk of diabetes. She was informed that eating too many simple carbohydrates or too many calories at one sitting increases the likelihood of GI side effects. We will check labs and Sydney Dominguez agrees to start diet prescription and follow up with [place] as directed to monitor her progress.  Diabetes risk counseling Aylen was given extended (15 minutes) diabetes prevention counseling today. She is 27 y.o. female and has risk factors for diabetes including obesity and pre-diabetes. We discussed intensive lifestyle modifications today with an emphasis on weight loss as well as increasing exercise and decreasing simple carbohydrates in her diet.  Vitamin D Deficiency Sydney Dominguez was informed that low vitamin D levels contributes to fatigue and are associated with obesity, breast, and colon cancer. We will check labs and follow. Sydney Dominguez will follow up for routine testing of vitamin D, at least 2-3 times per year.   Sydney Dominguez We will refer patient to Dr. Dewaine Conger, our bariatric psychologist for evaluation. Rodnesha agrees to follow up as directed.  Depression Screen Raeden had a strongly positive depression screening. Depression is commonly associated with obesity and often results in emotional eating behaviors. We will monitor this closely and work on CBT to help improve the non-hunger eating patterns. Referral to Psychology may be required if no improvement is seen as she continues in our clinic.  Obesity Sandi is currently in the action stage of change and her goal is to continue with weight loss efforts. I recommend Rosamae begin the structured treatment plan as follows:  She has agreed to follow the modified Pescatarian eating plan Liadan has been instructed  to eventually work up to a goal of 150 minutes of combined  cardio and strengthening exercise per week for weight loss and overall health benefits. We discussed the following Behavioral Modification Strategies today: increasing lean protein intake, decreasing simple carbohydrates and work on meal planning and easy cooking plans   She was informed of the importance of frequent follow up visits to maximize her success with intensive lifestyle modifications for her multiple health conditions. She was informed we would discuss her lab results at her next visit unless there is a critical issue that needs to be addressed sooner. Leanda agreed to keep her next visit at the agreed upon time to discuss these results.    OBESITY BEHAVIORAL INTERVENTION VISIT  Today's visit was # 1 out of 22.  Starting weight: 254 lbs Starting date: 09/20/17 Today's weight : 254 lbs  Today's date: 09/20/2017 Total lbs lost to date: 0 (Patients must lose 7 lbs in the first 6 months to continue with counseling)   ASK: We discussed the diagnosis of obesity with Sydney Dominguez today and Ahria agreed to give [place] permission to discuss obesity behavioral modification therapy today.  ASSESS: Ellawyn has the diagnosis of obesity and her BMI today is 45.01 Leena is in the action stage of change   ADVISE: Ethyle was educated on the multiple health risks of obesity as well as the benefit of weight loss to improve her health. She was advised of the need for long term treatment and the importance of lifestyle modifications.  AGREE: Multiple dietary modification options and treatment options were discussed and  Ailene agreed to the above obesity treatment plan.   I, Nevada Crane, am acting as transcriptionist for  Quillian Quince, MD  I have reviewed the above documentation for accuracy and completeness, and I agree with the above. -Quillian Quince, MD

## 2017-09-21 ENCOUNTER — Telehealth: Payer: Self-pay | Admitting: Student in an Organized Health Care Education/Training Program

## 2017-09-21 DIAGNOSIS — Z724 Inappropriate diet and eating habits: Secondary | ICD-10-CM

## 2017-09-21 LAB — INSULIN, RANDOM: INSULIN: 16.9 u[IU]/mL (ref 2.6–24.9)

## 2017-09-21 LAB — VITAMIN D 25 HYDROXY (VIT D DEFICIENCY, FRACTURES): Vit D, 25-Hydroxy: 19.9 ng/mL — ABNORMAL LOW (ref 30.0–100.0)

## 2017-09-21 LAB — LIPID PANEL WITH LDL/HDL RATIO
Cholesterol, Total: 183 mg/dL (ref 100–199)
HDL: 58 mg/dL (ref >39)
LDL Calculated: 112 mg/dL — ABNORMAL HIGH (ref 0–99)
LDl/HDL Ratio: 1.9 ratio (ref 0.0–3.2)
Triglycerides: 63 mg/dL (ref 0–149)
VLDL CHOLESTEROL CAL: 13 mg/dL (ref 5–40)

## 2017-09-21 LAB — CBC WITH DIFFERENTIAL
BASOS ABS: 0 10*3/uL (ref 0.0–0.2)
Basos: 0 %
EOS (ABSOLUTE): 0.1 10*3/uL (ref 0.0–0.4)
Eos: 2 %
HEMOGLOBIN: 11.8 g/dL (ref 11.1–15.9)
Hematocrit: 36.7 % (ref 34.0–46.6)
IMMATURE GRANULOCYTES: 0 %
Immature Grans (Abs): 0 10*3/uL (ref 0.0–0.1)
LYMPHS ABS: 1.2 10*3/uL (ref 0.7–3.1)
Lymphs: 24 %
MCH: 25.8 pg — ABNORMAL LOW (ref 26.6–33.0)
MCHC: 32.2 g/dL (ref 31.5–35.7)
MCV: 80 fL (ref 79–97)
MONOCYTES: 9 %
MONOS ABS: 0.4 10*3/uL (ref 0.1–0.9)
NEUTROS ABS: 3.4 10*3/uL (ref 1.4–7.0)
Neutrophils: 65 %
RBC: 4.58 x10E6/uL (ref 3.77–5.28)
RDW: 16.8 % — ABNORMAL HIGH (ref 12.3–15.4)
WBC: 5.2 10*3/uL (ref 3.4–10.8)

## 2017-09-21 LAB — COMPREHENSIVE METABOLIC PANEL
ALBUMIN: 4.3 g/dL (ref 3.5–5.5)
ALK PHOS: 52 IU/L (ref 39–117)
ALT: 13 IU/L (ref 0–32)
AST: 14 IU/L (ref 0–40)
Albumin/Globulin Ratio: 2 (ref 1.2–2.2)
BUN / CREAT RATIO: 5 — AB (ref 9–23)
BUN: 4 mg/dL — AB (ref 6–20)
CHLORIDE: 102 mmol/L (ref 96–106)
CO2: 23 mmol/L (ref 20–29)
Calcium: 9.5 mg/dL (ref 8.7–10.2)
Creatinine, Ser: 0.76 mg/dL (ref 0.57–1.00)
GFR calc Af Amer: 123 mL/min/{1.73_m2} (ref >59)
GFR calc non Af Amer: 107 mL/min/{1.73_m2} (ref >59)
GLOBULIN, TOTAL: 2.1 g/dL (ref 1.5–4.5)
GLUCOSE: 85 mg/dL (ref 65–99)
Potassium: 4 mmol/L (ref 3.5–5.2)
SODIUM: 140 mmol/L (ref 134–144)
Total Protein: 6.4 g/dL (ref 6.0–8.5)

## 2017-09-21 LAB — T3: T3 TOTAL: 169 ng/dL (ref 71–180)

## 2017-09-21 LAB — VITAMIN B12: VITAMIN B 12: 1142 pg/mL (ref 232–1245)

## 2017-09-21 LAB — TSH: TSH: 0.794 u[IU]/mL (ref 0.450–4.500)

## 2017-09-21 LAB — T4, FREE: FREE T4: 1.17 ng/dL (ref 0.82–1.77)

## 2017-09-21 LAB — HEMOGLOBIN A1C
Est. average glucose Bld gHb Est-mCnc: 111 mg/dL
HEMOGLOBIN A1C: 5.5 % (ref 4.8–5.6)

## 2017-09-21 LAB — FOLATE: FOLATE: 14.9 ng/mL (ref >3.0)

## 2017-09-21 NOTE — Telephone Encounter (Signed)
Pt needs a referral sent to Dr Lawerance CruelGaytri Barker psychiatrist. Fax [phone].  This is for emotional eating.  She is participating in the weight loss program offered by Dr Quillian Quincearen Beasley

## 2017-09-21 NOTE — Telephone Encounter (Signed)
Referred placed.

## 2017-10-02 ENCOUNTER — Other Ambulatory Visit: Payer: Self-pay | Admitting: Student in an Organized Health Care Education/Training Program

## 2017-10-02 DIAGNOSIS — F319 Bipolar disorder, unspecified: Secondary | ICD-10-CM

## 2017-10-03 MED ORDER — LAMOTRIGINE 200 MG PO TABS: 200.0000 mg | ORAL_TABLET | Freq: Every day | ORAL | 0 refills | Status: DC

## 2017-10-03 MED FILL — lamoTRIgine 200 MG TABS: 200 | 90 days supply | Qty: 90 | Fill #0

## 2017-10-05 ENCOUNTER — Ambulatory Visit (INDEPENDENT_AMBULATORY_CARE_PROVIDER_SITE_OTHER): Payer: No Typology Code available for payment source | Admitting: Psychology

## 2017-10-05 ENCOUNTER — Ambulatory Visit (INDEPENDENT_AMBULATORY_CARE_PROVIDER_SITE_OTHER): Payer: No Typology Code available for payment source | Admitting: Family Medicine

## 2017-10-05 VITALS — BP 103/68 | HR 91 | Temp 97.9°F | Ht 63.0 in | Wt 251.0 lb

## 2017-10-05 DIAGNOSIS — R7303 Prediabetes: Secondary | ICD-10-CM

## 2017-10-05 DIAGNOSIS — Z6841 Body Mass Index (BMI) 40.0 and over, adult: Secondary | ICD-10-CM | POA: Diagnosis not present

## 2017-10-05 DIAGNOSIS — F5081 Binge eating disorder: Secondary | ICD-10-CM

## 2017-10-05 DIAGNOSIS — E559 Vitamin D deficiency, unspecified: Secondary | ICD-10-CM

## 2017-10-05 DIAGNOSIS — Z9189 Other specified personal risk factors, not elsewhere classified: Secondary | ICD-10-CM

## 2017-10-05 MED ORDER — METFORMIN HCL 500 MG PO TABS: 500.0000 mg | ORAL_TABLET | Freq: Every day | ORAL | 0 refills | Status: DC

## 2017-10-05 MED ORDER — VITAMIN D (ERGOCALCIFEROL) 1.25 MG (50000 UNIT) PO CAPS: 50000.0000 [IU] | ORAL_CAPSULE | ORAL | 0 refills | Status: DC

## 2017-10-05 NOTE — Progress Notes (Signed)
Office: [phone]  /  Fax: [phone]   HPI:   Chief Complaint: OBESITY Sydney Dominguez is here to discuss her progress with her obesity treatment plan. She is on the Pescatarian eating plan and is following her eating plan approximately 85 % of the time. She states she is walking for 30 minutes 2 times per week. Sydney Dominguez did well with weight loss on her Pescatarian plan. She sis not struggle to eat all her food but did have 2 episodes of over indulging. She saw Dr. Dewaine Conger today for her 1st therapy visit, reason: emotional eating.  Her weight is 251 lb (113.9 kg) today and has had a weight loss of 3 pounds over a period of 2 weeks since her last visit. She has lost 3 lbs since starting treatment with [place].  Vitamin D Deficiency Sydney Dominguez has a new diagnosis of vitamin D deficiency. She is not on Vit D, she notes fatigue and denies nausea, vomiting or muscle weakness.  Pre-Diabetes Sydney Dominguez has a diagnosis of pre-diabetes based on her elevated Hgb A1c and was informed this puts her at greater risk of developing diabetes. A1c improved but fasting insulin elevated. She notes polyphagia. She is not taking metformin currently and continues to work on diet and exercise to decrease risk of diabetes. She denies nausea or hypoglycemia.  At risk for diabetes Sydney Dominguez is at higher than average risk for developing diabetes due to her obesity and pre-diabetes. She currently denies polyuria or polydipsia.  ALLERGIES: No Known Allergies  MEDICATIONS: Current Outpatient Medications on File Prior to Visit  Medication Sig Dispense Refill  . lamoTRIgine (LAMICTAL) 200 MG tablet Take 1 tablet (200 mg total) by mouth daily. 90 tablet 0  . Spirulina POWD 5 mg by Does not apply route 3 (three) times daily.     No current facility-administered medications on file prior to visit.     PAST MEDICAL HISTORY: Past Medical History:  Diagnosis Date  . Anemia   . Anxiety   . Asthma   . B12 deficiency   . Back  pain   . Bipolar disorder (HCC)   . Chest pain   . Constipation   . Depression   . Dyspnea   . GERD (gastroesophageal reflux disease)   . Palpitations   . Prediabetes   . Schizophrenia (HCC)   . Vertigo   . Vitamin D deficiency     PAST SURGICAL HISTORY: Past Surgical History:  Procedure Laterality Date  . HAND SURGERY     l hand    SOCIAL HISTORY: Social History   Tobacco Use  . Smoking status: Never Smoker  . Smokeless tobacco: Never Used  Substance Use Topics  . Alcohol use: No    Alcohol/week: 0.0 oz  . Drug use: No    FAMILY HISTORY: Family History  Problem Relation Age of Onset  . Diabetes Mother   . Hypertension Mother   . Thyroid disease Mother   . Depression Mother   . Anxiety disorder Mother   . Obesity Mother   . Hypertension Father   . Hyperlipidemia Father   . Depression Father   . Anxiety disorder Father   . Bipolar disorder Father   . Schizophrenia Father   . Alcoholism Father   . Eating disorder Father   . Hypertension Maternal Grandmother   . Cancer Maternal Grandfather        prostate  . Anesthesia problems Neg Hx     ROS: Review of Systems  Constitutional: Positive for malaise/fatigue  and weight loss.  Gastrointestinal: Negative for nausea and vomiting.  Genitourinary: Negative for frequency.  Musculoskeletal:       Negative muscle weakness  Endo/Heme/Allergies: Negative for polydipsia.       Positive polyphagia Negative hypoglycemia    PHYSICAL EXAM: Blood pressure 103/68, pulse 91, temperature 97.9 F (36.6 C), temperature source Oral, height 5\' 3"  (1.6 m), weight 251 lb (113.9 kg), last menstrual period 10/01/2017, SpO2 100 %. Body mass index is 44.46 kg/m. Physical Exam  Constitutional: She is oriented to person, place, and time. She appears well-developed and well-nourished.  Cardiovascular: Normal rate.  Pulmonary/Chest: Effort normal.  Musculoskeletal: Normal range of motion.  Neurological: She is oriented to  person, place, and time.  Skin: Skin is warm and dry.  Psychiatric: She has a normal mood and affect. Her behavior is normal.  Vitals reviewed.   RECENT LABS AND TESTS: BMET    Component Value Date/Time   NA 140 09/20/2017 1033   K 4.0 09/20/2017 1033   CL 102 09/20/2017 1033   CO2 23 09/20/2017 1033   GLUCOSE 85 09/20/2017 1033   GLUCOSE 94 09/20/2012 1400   BUN 4 (L) 09/20/2017 1033   CREATININE 0.76 09/20/2017 1033   CALCIUM 9.5 09/20/2017 1033   GFRNONAA 107 09/20/2017 1033   GFRAA 123 09/20/2017 1033   Lab Results  Component Value Date   HGBA1C 5.5 09/20/2017   Lab Results  Component Value Date   INSULIN 16.9 09/20/2017   CBC    Component Value Date/Time   WBC 5.2 09/20/2017 1033   WBC 7.8 09/20/2012 1400   RBC 4.58 09/20/2017 1033   RBC 4.79 09/20/2012 1400   HGB 11.8 09/20/2017 1033   HCT 36.7 09/20/2017 1033   PLT 371 09/20/2012 1400   MCV 80 09/20/2017 1033   MCH 25.8 (L) 09/20/2017 1033   MCH 28.4 09/20/2012 1400   MCHC 32.2 09/20/2017 1033   MCHC 34.2 09/20/2012 1400   RDW 16.8 (H) 09/20/2017 1033   LYMPHSABS 1.2 09/20/2017 1033   MONOABS 0.6 09/20/2012 1400   EOSABS 0.1 09/20/2017 1033   BASOSABS 0.0 09/20/2017 1033   Iron/TIBC/Ferritin/ %Sat No results found for: IRON, TIBC, FERRITIN, IRONPCTSAT Lipid Panel     Component Value Date/Time   CHOL 183 09/20/2017 1033   TRIG 63 09/20/2017 1033   HDL 58 09/20/2017 1033   LDLCALC 112 (H) 09/20/2017 1033   Hepatic Function Panel     Component Value Date/Time   PROT 6.4 09/20/2017 1033   ALBUMIN 4.3 09/20/2017 1033   AST 14 09/20/2017 1033   ALT 13 09/20/2017 1033   ALKPHOS 52 09/20/2017 1033   BILITOT <0.2 09/20/2017 1033      Component Value Date/Time   TSH 0.794 09/20/2017 1033  Results for Sydney Dominguez, Sydney Dominguez (MRN [id number]) as of 10/05/2017 17:36  Ref. Range 09/20/2017 10:33  Vitamin D, 25-Hydroxy Latest Ref Range: 30.0 - 100.0 ng/mL 19.9 (L)    ASSESSMENT AND PLAN: Vitamin D  deficiency - Plan: Vitamin D, Ergocalciferol, (DRISDOL) 50000 units CAPS capsule  Prediabetes - Plan: metFORMIN (GLUCOPHAGE) 500 MG tablet  At risk for diabetes mellitus  Class 3 severe obesity with serious comorbidity and body mass index (BMI) of 40.0 to 44.9 in adult, unspecified obesity type (HCC)  PLAN:  Vitamin D Deficiency Sydney Dominguez was informed that low vitamin D levels contributes to fatigue and are associated with obesity, breast, and colon cancer. Sydney Dominguez agrees to start prescription Vit D @50 ,000 IU every week #4 with  no refills. She will follow up for routine testing of vitamin D, at least 2-3 times per year. She was informed of the risk of over-replacement of vitamin D and agrees to not increase her dose unless she discusses this with [place] first. Sydney Dominguez agrees to follow up with our clinic in 2 to 3 weeks.  Pre-Diabetes Sydney Dominguez will continue to work on weight loss, exercise, and decreasing simple carbohydrates in her diet to help decrease the risk of diabetes. We dicussed metformin including benefits and risks. She was informed that eating too many simple carbohydrates or too many calories at one sitting increases the likelihood of GI side effects. Ninel agrees to start metformin 500 mg q AM #30 with no refills. Sydney Dominguez agrees to follow up with our clinic in 2 to 3 weeks as directed to monitor her progress.  Diabetes risk counselling Sydney Dominguez was given extended (15 minutes) diabetes prevention counseling today. She is 27 y.o. female and has risk factors for diabetes including obesity and pre-diabetes. We discussed intensive lifestyle modifications today with an emphasis on weight loss as well as increasing exercise and decreasing simple carbohydrates in her diet.  Obesity Sydney Dominguez is currently in the action stage of change. As such, her goal is to continue with weight loss efforts She has agreed to follow the Pescatarian eating plan Sydney Dominguez has been instructed to work up to a  goal of 150 minutes of combined cardio and strengthening exercise per week for weight loss and overall health benefits. We discussed the following Behavioral Modification Strategies today: increasing lean protein intake, decreasing simple carbohydrates  and emotional eating strategies   Sydney Dominguez has agreed to follow up with our clinic in 2 to 3 weeks. She was informed of the importance of frequent follow up visits to maximize her success with intensive lifestyle modifications for her multiple health conditions.   OBESITY BEHAVIORAL INTERVENTION VISIT  Today's visit was # 2 out of 22.  Starting weight: 254 lbs Starting date: 09/20/17 Today's weight : 251 lbs  Today's date: 10/05/2017 Total lbs lost to date: 3 (Patients must lose 7 lbs in the first 6 months to continue with counseling)   ASK: We discussed the diagnosis of obesity with Sydney Dominguez today and Sydney Dominguez agreed to give [place] permission to discuss obesity behavioral modification therapy today.  ASSESS: Sydney Dominguez has the diagnosis of obesity and her BMI today is 44.47 Sydney Dominguez is in the action stage of change   ADVISE: Sydney Dominguez was educated on the multiple health risks of obesity as well as the benefit of weight loss to improve her health. She was advised of the need for long term treatment and the importance of lifestyle modifications.  AGREE: Multiple dietary modification options and treatment options were discussed and  Sydney Dominguez agreed to the above obesity treatment plan.  I, Burt Knack, am acting as transcriptionist for Quillian Quince, MD  I have reviewed the above documentation for accuracy and completeness, and I agree with the above. -Quillian Quince, MD
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
## 2017-10-11 ENCOUNTER — Telehealth (INDEPENDENT_AMBULATORY_CARE_PROVIDER_SITE_OTHER): Payer: Self-pay | Admitting: Family Medicine

## 2017-10-11 ENCOUNTER — Encounter (INDEPENDENT_AMBULATORY_CARE_PROVIDER_SITE_OTHER): Payer: Self-pay

## 2017-10-11 NOTE — Telephone Encounter (Signed)
Pt requesting metformin and vitamin d called into [facility] on [address]

## 2017-10-11 NOTE — Telephone Encounter (Signed)
Patient sent a my chart message. April, CMA

## 2017-10-12 MED FILL — metFORMIN HCL 500 MG TABS: 500 | 30 days supply | Qty: 30 | Fill #0

## 2017-10-12 MED FILL — VIT D2 1.25 MG (50,000 UNIT: 1.25 MG | 28 days supply | Qty: 4 | Fill #0

## 2017-10-18 NOTE — Progress Notes (Unsigned)
Office: [phone]  /  Fax: [phone]  Date: October 31, 2017 Time Seen:*** Duration:*** Provider: Lawerance CruelGaytri Dominguez, Psy.D. Type of Session: Individual Therapy   HPI: Sydney Dominguez was referred by Dr. Quillian Quincearen Dominguez. Her modified PHQ-9 score at her visit on September 20, 2017 with Dr. Dalbert Dominguez was 25. During the initial visit with this provider on October 05, 2017, Sydney Dominguez shared she has Binge Eating Disorder and noted, "I've been doing it since I was 14eight or 46nine years old." She shared she uses "food as an outlet," craves carbohydrates, eats when she is stressed, and wakes up at night to eat. She also eats when experiencing other emotions, such as "happy" and "angry." Sydney Dominguez also shared she eats when dealing with her family as they often sabotage her. Currently, Sydney Dominguez attempts portion control. Sydney Dominguez reported she last binged the night before her initial visit with this provider. She indicated she made spaghetti with veggie crumbles. She also ate two peanut butter and jelly sandwiches, fruit, and an additional sandwich. Sydney Dominguez reported she ate two other things, but could not recall what she ate. The aforementioned was consumed over the course of an hour. She indicated she binges every day. Sydney Dominguez described feeling "nasty" afterward and feels she has done something wrong. She noted she sometimes becomes tearful. Sydney Dominguez noted she does not fully chew and does not enjoy her food. She eats to satisfy a craving or urge. Per Sydney Dominguez, she binges two to three times a day and is triggered by her family. After "cutting them off," family stress has decreased. Currently, Sydney Dominguez also experiences racing thoughts, increased energy, and social withdrawal. Two days prior to the initial visit with this provider, she noted she was really down and it made her feel "sluggish" and "hopeless." In addition, Sydney Dominguez reported a desire for bariatric surgery, but noted she plans to improve her eating habits and reduce binge eating  prior to the surgery. Moreover, she expressed a desire to be healthy, live longer, and be around to see her daughter graduate. She noted, "I'm taking control." Furthermore, Sydney Dominguez shared Binge Eating Disorder has never been "formally diagnosed" by a doctor as she "always hid it."   Session Content: Session focused on the goals of reducing emotional and binge eating and increasing coping skills. The session was initiated with a brief check-in.   The provider also checked-in regarding binging behaviors since the last appointment.   Session then focused on reviewing emotional versus physical hunger.   Sydney Dominguez was provided psychoeducation regarding triggers for emotional eating.   Moreover, psychoeducation regarding thought defusion was provided, and Sydney Dominguez was led through a thought defusion exercise.      Sydney Dominguez was receptive to today's session as evidenced by ***.  Structured Assessments Results: The Patient Health Questionnaire-9 (PHQ-9) is a self-report measure that assesses symptoms and severity of depression over the course of the last two weeks. Sydney Dominguez obtained a score of *** suggesting *** depression. The following items were endorsed: ***. Sydney Dominguez finds the aforementioned symptoms to be ***  The Generalized Anxiety Disorder-7 (GAD-7) is a brief self-report measure that assesses symptoms of anxiety over the course of the last two weeks. Sydney Dominguez obtained a score of *** suggesting *** anxiety.  The following items were endorsed: ***. Sydney Dominguez finds the aforementioned symptoms to be ***  Mental Status Examination: Sydney Dominguez arrived on time for the appointment. She presented as appropriately dressed and groomed. Sydney Dominguez appeared her stated age and demonstrated adequate orientation to time, place, person, and purpose of the appointment. She  also demonstrated appropriate eye contact. No psychomotor abnormalities or behavioral peculiarities noted. Her mood was *** with congruent affect.  Her thought processes were logical, linear, and goal-directed. No hallucinations, delusions, bizarre thinking or behavior reported or observed. Judgment, insight, and impulse control appeared to be grossly intact. There was no evidence of paraphasias (i.e., errors in speech, gross mispronunciations, and word substitutions), repetition deficits, or disturbances in volume or prosody (i.e., rhythm and intonation). There was no evidence of attention or memory impairments. Sydney Dominguez denied current suicidal and homicidal ideation, intent or plan.  Interventions: Content from the last session was reviewed (I.e., emotional versus physical hunger) and she was administered the PHQ-9 and GAD-7 for symptom monitoring. Throughout today's session, empathic reflections and validation were provided. Psychoeducation regarding triggers for emotional eating was provided. Sydney Dominguez was given a handout about the aforementioned. Psychoeducation regarding thought defusion was also provided, and Sydney Dominguez was led through a thought defusion exercise.   DSM-5 Diagnosis: 307.51 (F50.8) Binge-Eating Disorder, Extreme and 296.89 (F31.81) Bipolar II Disorder (by history)    Plan: [name] continues to appear able and willing to participate as evidenced by ***. The next appointment will be scheduled in ***. Session will focus on reviewing learned skills, and working toward established treatment goals.

## 2017-10-31 ENCOUNTER — Encounter (INDEPENDENT_AMBULATORY_CARE_PROVIDER_SITE_OTHER): Payer: Self-pay

## 2017-10-31 ENCOUNTER — Ambulatory Visit (INDEPENDENT_AMBULATORY_CARE_PROVIDER_SITE_OTHER): Payer: Self-pay | Admitting: Family Medicine

## 2017-10-31 ENCOUNTER — Ambulatory Visit (INDEPENDENT_AMBULATORY_CARE_PROVIDER_SITE_OTHER): Payer: Self-pay | Admitting: Psychology

## 2017-11-02 NOTE — Progress Notes (Signed)
Office: [phone]  /  Fax: [phone]   Date: November 15, 2017 Time Seen: 7:55am Duration: 35 minutes Provider: Lawerance CruelGaytri Willy Pinkerton, Psy.D. Type of Session: Individual Therapy   HPI: Ama was referred by Dr. Quillian Quincearen Beasley. Her modified PHQ-9 score at her visit on September 20, 2017 with Dr. Dalbert GarnetBeasley was 25. During the initial appointment with this provider on October 05, 2017, Fardowsa shared she has Binge Eating Disorder and noted, "I've been doing it since I was 79eight or 6nine years old." She shared she uses "food as an outlet," craves carbohydrates, eats when she is stressed, and wakes up at night to eat. She also eats when experiencing other emotions, such as "happy" and "angry." Laporchia also shared she eats when dealing with her family as they often sabotage her. Currently, Lahoma attempts portion control. Anola reported she last binged the night before the initial appointment with this provider. She indicated she made spaghetti with veggie crumbles. She also ate two peanut butter and jelly sandwiches, fruit, and an additional sandwich. Chasitty reported she ate two other things, but could not recall what she ate. The aforementioned was consumed over the course of an hour. She indicated she binges every day. Zannah described feeling "nasty" afterward and feels she has done something wrong. She noted she sometimes becomes tearful. Goldia noted she does not fully chew and does not enjoy her food. She eats to satisfy a craving or urge. Per Judine, she binges two to three times a day and is triggered by her family. After "cutting them off," family stress has decreased. Currently, Shaneil also experiences racing thoughts, increased energy, and social withdrawal. Two days ago, she noted she was really down and it made her feel "sluggish" and hopeless." In addition, Saumya reported a desire for bariatric surgery, but noted she plans to improve her eating habits and reduce binge eating prior to the  surgery. Moreover, she expressed a desire to be healthy, live longer, and be around to see her daughter graduate. She noted, "I'm taking control." During the initial appointment with this provider on October 05, 2017, Anissa shared Binge Eating Disorder has never been "formally diagnosed" by a doctor as she "always hid it." As noted above, she began binge eating at the age of [age].   Session Content: Session focused on the goals of reducing emotional and binge eating and increasing coping skills.  The session was initiated with the administration of the PHQ-9 and GAD-7, as well as a a brief check-in. Chanel reported, "Everything has been going pretty good with the overeating." She described stopping and thinking prior to eating. Yaneisy also discussed challenges; however, acknowledged the handout the awareness gained from the handout outlining emotional versus physical hunger has helped her. Moreover, Surya shared listening to audio books or coloring with her daughter have assisted in avoiding eating when experiencing emotional hunger. She expressed experiencing greater confidence in her ability to become healthy and lose weight. Remainder of session focused on discussing triggers for emotional eating. Diya was provided a handout.   Helayne was receptive to today's session as evidenced by discussing her progress and challenges since the last appointment, asking questions for clarification, and making notes on the handout provided during today's session.  Mental Status Examination: Latisha arrived early for the appointment; therefore, the appointment was initiated early. She was accompanied by her daughter; however, she was sleeping. Shevawn also brought headphones should her daughter wake up during session.  She presented as appropriately dressed and groomed. Liyla  appeared her stated age and demonstrated adequate orientation to time, place, person, and purpose of the appointment. She  also demonstrated appropriate eye contact. No psychomotor abnormalities or behavioral peculiarities noted. Her mood was euthymic with congruent affect. Her thought processes were logical, linear, and goal-directed. No hallucinations, delusions, bizarre thinking or behavior reported or observed. Judgment, insight, and impulse control appeared to be grossly intact. There was no evidence of paraphasias (i.e., errors in speech, gross mispronunciations, and word substitutions), repetition deficits, or disturbances in volume or prosody (i.e., rhythm and intonation). There was no evidence of attention or memory impairments. Oanh denied current suicidal and homicidal ideation, intent or plan.  Structured Assessments Results: The Patient Health Questionnaire-9 (PHQ-9) is a self-report measure that assesses symptoms and severity of depression over the course of the last two weeks. Ragena obtained a score of five suggesting mild depression. Marybeth finds the endorsed symptoms to be somewhat difficult.  Depression screen [facility] 2/9 11/15/2017  Decreased Interest 0  Down, Depressed, Hopeless 0  PHQ - 2 Score 0  Altered sleeping 1  Tired, decreased energy 1  Change in appetite 1  Feeling bad or failure about yourself  0  Trouble concentrating 1  Moving slowly or fidgety/restless 1  Suicidal thoughts 0  PHQ-9 Score 5  Difficult doing work/chores -   The Generalized Anxiety Disorder-7 (GAD-7) is a brief self-report measure that assesses symptoms of anxiety over the course of the last two weeks. Bhavana obtained a score of three suggesting minimal anxiety.  GAD 7 : Generalized Anxiety Score 11/15/2017 10/05/2017 06/29/2017  Nervous, Anxious, on Edge 1 2 1   Control/stop worrying 0 3 0  Worry too much - different things 0 3 0  Trouble relaxing 0 2 1  Restless 1 3 -  Easily annoyed or irritable 0 3 0  Afraid - awful might happen 1 3 0  Total GAD 7 Score 3 19 -  Anxiety Difficulty Somewhat difficult Very  difficult -   Interventions: Gerald was administered the PHQ-9 and GAD-7 for symptom monitoring. Content from the last session was reviewed (I.e., physical versus emotional hunger). Throughout today's session, empathic reflections and validation were provided. Psychoeducation regarding triggers for emotional eating was provided. Virgina was given a handout and encouraged to utilize it between now and the next appointment to increase awareness regarding the frequency of specific triggers. She agreed.   DSM-5 Diagnosis: 307.51 (F50.8) Binge-Eating Disorder, Extreme and 296.89 (F31.81) Bipolar II Disorder (by history)   Plan: Lalitha continues to appear able and willing to participate as evidenced by engagement in reciprocal conversation, taking notes during session, and asking questions for clarification as appropriate.The next appointment will be scheduled in two weeks. The next session will focus on reviewing learned skills, and working towards established treatment goals.

## 2017-11-14 ENCOUNTER — Ambulatory Visit (INDEPENDENT_AMBULATORY_CARE_PROVIDER_SITE_OTHER): Payer: Self-pay | Admitting: Psychology

## 2017-11-15 ENCOUNTER — Ambulatory Visit (INDEPENDENT_AMBULATORY_CARE_PROVIDER_SITE_OTHER): Payer: No Typology Code available for payment source | Admitting: Family Medicine

## 2017-11-15 ENCOUNTER — Ambulatory Visit (INDEPENDENT_AMBULATORY_CARE_PROVIDER_SITE_OTHER): Payer: No Typology Code available for payment source | Admitting: Psychology

## 2017-11-15 VITALS — BP 111/76 | HR 65 | Temp 98.1°F | Ht 63.0 in | Wt 248.0 lb

## 2017-11-15 DIAGNOSIS — R7303 Prediabetes: Secondary | ICD-10-CM

## 2017-11-15 DIAGNOSIS — F5081 Binge eating disorder: Secondary | ICD-10-CM | POA: Diagnosis not present

## 2017-11-15 DIAGNOSIS — E559 Vitamin D deficiency, unspecified: Secondary | ICD-10-CM

## 2017-11-15 DIAGNOSIS — Z6841 Body Mass Index (BMI) 40.0 and over, adult: Secondary | ICD-10-CM

## 2017-11-15 DIAGNOSIS — E66813 Obesity, class 3: Secondary | ICD-10-CM

## 2017-11-15 DIAGNOSIS — Z9189 Other specified personal risk factors, not elsewhere classified: Secondary | ICD-10-CM

## 2017-11-15 MED ORDER — METFORMIN HCL 500 MG PO TABS: 500.0000 mg | ORAL_TABLET | Freq: Every day | ORAL | 0 refills | Status: DC

## 2017-11-15 MED ORDER — VITAMIN D (ERGOCALCIFEROL) 1.25 MG (50000 UNIT) PO CAPS: 50000.0000 [IU] | ORAL_CAPSULE | ORAL | 0 refills | Status: DC

## 2017-11-15 MED FILL — VIT D2 1.25 MG (50,000 UNIT: 1.25 MG | 28 days supply | Qty: 4 | Fill #0

## 2017-11-15 MED FILL — metFORMIN HCL 500 MG TABS: 500 | 30 days supply | Qty: 30 | Fill #0

## 2017-11-15 NOTE — Progress Notes (Unsigned)
Office: [phone]  /  Fax: [phone]  Date: November 29, 2017 Time Seen:*** Duration:*** Provider: Lawerance Cruel, Psy.D. Type of Session: Individual Therapy   HPI: Sydney Dominguez referred by Dr. Quillian Quince. Her modified PHQ-9 score at her visit on September 20, 2017 with Dr. Dalbert Garnet was 25. During the initial appointment with this provider on October 05, 2017, Sydney Dominguez shared she has Binge Eating Disorder and noted, "I've been doing it since I was 27 or 27 years old." She shared she uses "food as an outlet," craves carbohydrates, eats when she is stressed, and wakes up at night to eat. She also eats when experiencing other emotions, such as "happy" and "angry." Sydney Dominguez also shared she eats when dealing with her family as they often sabotage her. Currently, Amos attempts portion control. Sydney Dominguez reported she last binged the night before the initial appointment with this provider. She indicated she made spaghetti with veggie crumbles. She also ate two peanut butter and jelly sandwiches, fruit, and an additional sandwich. Sydney Dominguez reported she ate two other things, but could not recall what she ate. The aforementioned was consumed over the course of an hour. She indicated she binges every day. Sydney Dominguez described feeling "nasty" afterward and feels she has done something wrong. She noted she sometimes becomes tearful. Sydney Dominguez noted she does not fully chew and does not enjoy her food. She eats to satisfy a craving or urge. Per Sydney Dominguez, she binges two to three times a day and is triggered by her family. After "cutting them off," family stress has decreased. Currently, Sydney Dominguez also experiences racing thoughts, increased energy, and social withdrawal. Two days ago, she noted she was really down and it made her feel "sluggish" and hopeless." In addition, Sydney Dominguez reported a desire for bariatric surgery, but noted she plans to improve her eating habits and reduce binge eating prior to the surgery.  Moreover, she expressed a desire to be healthy, live longer, and be around to see her daughter graduate. She noted, "I'm taking control." During the initial appointment with this provider on October 05, 2017, Sydney Dominguez shared Binge Eating Disorder has never been "formally diagnosed" by a doctor as she "always hid it." As noted above, she began binge eating at the age of 27 or 23.  Session Content: Session focused on the goals of reducing emotional and binge eating and increasing coping skills. The session was initiated with the administration of the PHQ-9 and GAD-7, as well as a a brief check-in.   Herberta was receptive to today's session as evidenced by ***.  Mental Status Examination: Sydney Dominguez arrived on time for the appointment. She presented as appropriately dressed and groomed. Sydney Dominguez appeared her stated age and demonstrated adequate orientation to time, place, person, and purpose of the appointment. She also demonstrated appropriate eye contact. No psychomotor abnormalities or behavioral peculiarities noted. Her mood was *** with congruent affect. Her thought processes were logical, linear, and goal-directed. No hallucinations, delusions, bizarre thinking or behavior reported or observed. Judgment, insight, and impulse control appeared to be grossly intact. There was no evidence of paraphasias (i.e., errors in speech, gross mispronunciations, and word substitutions), repetition deficits, or disturbances in volume or prosody (i.e., rhythm and intonation). There was no evidence of attention or memory impairments. Sydney Dominguez denied current suicidal and homicidal ideation, intent or plan.  Interventions: Sydney Dominguez was administered the PHQ-9 and GAD-7 for symptom monitoring. Content from the last session was reviewed. Throughout today's session, empathic reflections and validation were provided. Psychoeducation regarding *** was provided and *** [insert  other interventions].   DSM-5 Diagnoses: 307.51  (F50.8) Binge-Eating Disorder, Extreme and 296.89 (F31.81) Bipolar II Disorder (by history)  Plan: Sydney Dominguez continues to appear able and willing to participate as evidenced by engagement in reciprocal conversation, and asking questions for clarification as appropriate.*** The next appointment will be scheduled in ***. The next session will focus on reviewing learned skills, and working towards established treatment goals.

## 2017-11-16 NOTE — Progress Notes (Signed)
Office: [phone]  /  Fax: [phone]   HPI:   Chief Complaint: OBESITY Sydney Dominguez is here to discuss her progress with her obesity treatment plan. She is on the Pescatarian eating plan and is following her eating plan approximately 25-50 % of the time. She states she is walking for 30 minutes 1-2 times per week. Sydney Dominguez is doing well with weight loss but getting bored with her plan and would like to look at other options. She is also considering weight loss surgery and has question about how to go about this.  Her weight is 248 lb (112.5 kg) today and has had a weight loss of 3 pounds over a period of 6 weeks since her last visit. She has lost 6 lbs since starting treatment with Sydney Dominguez.  Pre-Diabetes Sydney Dominguez has a diagnosis of pre-diabetes based on her elevated Hgb A1c and was informed this puts her at greater risk of developing diabetes. She is stable on metformin, she feels it helps with polyphagia. continues to work on diet and exercise to decrease risk of diabetes. She denies nausea or hypoglycemia.  At risk for diabetes Sydney Dominguez is at higher than average risk for developing diabetes due to her obesity and pre-diabetes. She currently denies polyuria or polydipsia.  Vitamin D Deficiency Sydney Dominguez has a diagnosis of vitamin D deficiency. She is stable on prescription Vit D and denies nausea, vomiting or muscle weakness.  ALLERGIES: No Known Allergies  MEDICATIONS: Current Outpatient Medications on File Prior to Visit  Medication Sig Dispense Refill  . lamoTRIgine (LAMICTAL) 200 MG tablet Take 1 tablet (200 mg total) by mouth daily. 90 tablet 0  . Spirulina POWD 5 mg by Does not apply route 3 (three) times daily.     No current facility-administered medications on file prior to visit.     PAST MEDICAL HISTORY: Past Medical History:  Diagnosis Date  . Anemia   . Anxiety   . Asthma   . B12 deficiency   . Back pain   . Bipolar disorder (HCC)   . Chest pain   . Constipation    . Depression   . Dyspnea   . GERD (gastroesophageal reflux disease)   . Palpitations   . Prediabetes   . Schizophrenia (HCC)   . Vertigo   . Vitamin D deficiency     PAST SURGICAL HISTORY: Past Surgical History:  Procedure Laterality Date  . HAND SURGERY     l hand    SOCIAL HISTORY: Social History   Tobacco Use  . Smoking status: Never Smoker  . Smokeless tobacco: Never Used  Substance Use Topics  . Alcohol use: No    Alcohol/week: 0.0 oz  . Drug use: No    FAMILY HISTORY: Family History  Problem Relation Age of Onset  . Diabetes Mother   . Hypertension Mother   . Thyroid disease Mother   . Depression Mother   . Anxiety disorder Mother   . Obesity Mother   . Hypertension Father   . Hyperlipidemia Father   . Depression Father   . Anxiety disorder Father   . Bipolar disorder Father   . Schizophrenia Father   . Alcoholism Father   . Eating disorder Father   . Hypertension Maternal Grandmother   . Cancer Maternal Grandfather        prostate  . Anesthesia problems Neg Hx     ROS: Review of Systems  Constitutional: Positive for weight loss.  Gastrointestinal: Negative for nausea and vomiting.  Genitourinary: Negative  for frequency.  Musculoskeletal:       Negative muscle weakness  Endo/Heme/Allergies: Negative for polydipsia.       Negative hypoglycemia    PHYSICAL EXAM: Blood pressure 111/76, pulse 65, temperature 98.1 F (36.7 C), temperature source Oral, height 5\' 3"  (1.6 m), weight 248 lb (112.5 kg), last menstrual period 11/04/2017, SpO2 100 %. Body mass index is 43.93 kg/m. Physical Exam  Constitutional: She is oriented to person, place, and time. She appears well-developed and well-nourished.  Cardiovascular: Normal rate.  Pulmonary/Chest: Effort normal.  Musculoskeletal: Normal range of motion.  Neurological: She is oriented to person, place, and time.  Skin: Skin is warm and dry.  Psychiatric: She has a normal mood and affect. Her  behavior is normal.  Vitals reviewed.   RECENT LABS AND TESTS: BMET    Component Value Date/Time   NA 140 09/20/2017 1033   K 4.0 09/20/2017 1033   CL 102 09/20/2017 1033   CO2 23 09/20/2017 1033   GLUCOSE 85 09/20/2017 1033   GLUCOSE 94 09/20/2012 1400   BUN 4 (L) 09/20/2017 1033   CREATININE 0.76 09/20/2017 1033   CALCIUM 9.5 09/20/2017 1033   GFRNONAA 107 09/20/2017 1033   GFRAA 123 09/20/2017 1033   Lab Results  Component Value Date   HGBA1C 5.5 09/20/2017   Lab Results  Component Value Date   INSULIN 16.9 09/20/2017   CBC    Component Value Date/Time   WBC 5.2 09/20/2017 1033   WBC 7.8 09/20/2012 1400   RBC 4.58 09/20/2017 1033   RBC 4.79 09/20/2012 1400   HGB 11.8 09/20/2017 1033   HCT 36.7 09/20/2017 1033   PLT 371 09/20/2012 1400   MCV 80 09/20/2017 1033   MCH 25.8 (L) 09/20/2017 1033   MCH 28.4 09/20/2012 1400   MCHC 32.2 09/20/2017 1033   MCHC 34.2 09/20/2012 1400   RDW 16.8 (H) 09/20/2017 1033   LYMPHSABS 1.2 09/20/2017 1033   MONOABS 0.6 09/20/2012 1400   EOSABS 0.1 09/20/2017 1033   BASOSABS 0.0 09/20/2017 1033   Iron/TIBC/Ferritin/ %Sat No results found for: IRON, TIBC, FERRITIN, IRONPCTSAT Lipid Panel     Component Value Date/Time   CHOL 183 09/20/2017 1033   TRIG 63 09/20/2017 1033   HDL 58 09/20/2017 1033   LDLCALC 112 (H) 09/20/2017 1033   Hepatic Function Panel     Component Value Date/Time   PROT 6.4 09/20/2017 1033   ALBUMIN 4.3 09/20/2017 1033   AST 14 09/20/2017 1033   ALT 13 09/20/2017 1033   ALKPHOS 52 09/20/2017 1033   BILITOT <0.2 09/20/2017 1033      Component Value Date/Time   TSH 0.794 09/20/2017 1033  Results for Sydney EatonDANIEL, Kasidee (MRN [id number]) as of 11/16/2017 14:08  Ref. Range 09/20/2017 10:33  Vitamin D, 25-Hydroxy Latest Ref Range: 30.0 - 100.0 ng/mL 19.9 (L)    ASSESSMENT AND PLAN: Prediabetes - Plan: metFORMIN (GLUCOPHAGE) 500 MG tablet  Vitamin D deficiency - Plan: Vitamin D, Ergocalciferol, (DRISDOL)  50000 units CAPS capsule  At risk for diabetes mellitus  Class 3 severe obesity with serious comorbidity and body mass index (BMI) of 40.0 to 44.9 in adult, unspecified obesity type (HCC)  PLAN:  Pre-Diabetes Lupie will continue to work on weight loss, exercise, and decreasing simple carbohydrates in her diet to help decrease the risk of diabetes. We dicussed metformin including benefits and risks. She was informed that eating too many simple carbohydrates or too many calories at one sitting increases the likelihood of  GI side effects. Sydney Dominguez agrees to continue taking metformin 500 mg q AM #30 and we will refill for 1 month. Sydney Dominguez agrees to follow up with our clinic in 2 weeks with Sydney Cliche, PA-C as directed to monitor her progress.  Diabetes risk counselling Sydney Dominguez was given extended (15 minutes) diabetes prevention counseling today. She is 27 y.o. female and has risk factors for diabetes including obesity and pre-diabetes. We discussed intensive lifestyle modifications today with an emphasis on weight loss as well as increasing exercise and decreasing simple carbohydrates in her diet.  Vitamin D Deficiency Sydney Dominguez was informed that low vitamin D levels contributes to fatigue and are associated with obesity, breast, and colon cancer. Sydney Dominguez agrees to continue taking prescription Vit D @50 ,000 IU every week #4 and we will refill for 1 month. She will follow up for routine testing of vitamin D, at least 2-3 times per year. She was informed of the risk of over-replacement of vitamin D and agrees to not increase her dose unless she discusses this with Sydney Dominguez first. Sydney Dominguez agrees to follow up with our clinic in 2 weeks with Sydney Cliche, PA-C.  Obesity Sydney Dominguez is currently in the action stage of change. As such, her goal is to continue with weight loss efforts She has agreed to change to follow a lower carbohydrate, vegetable and lean protein rich diet plan Sydney Dominguez has been  instructed to work up to a goal of 150 minutes of combined cardio and strengthening exercise per week for weight loss and overall health benefits. We discussed the following Behavioral Modification Strategies today: increasing lean protein intake and emotional eating strategies Aryani was educated on the weight loss surgery process and she agreed to go to an information session with [facility] to gather more information.  Camani has agreed to follow up with our clinic in 2 weeks with Sydney Cliche, PA-C. She was informed of the importance of frequent follow up visits to maximize her success with intensive lifestyle modifications for her multiple health conditions.   OBESITY BEHAVIORAL INTERVENTION VISIT  Today's visit was # 4 out of 22.  Starting weight: 254 lbs Starting date: 09/20/17 Today's weight : 248 lbs  Today's date: 11/15/2017 Total lbs lost to date: 6    ASK: We discussed the diagnosis of obesity with Sydney Dominguez today and Sydney Dominguez agreed to give Sydney Dominguez permission to discuss obesity behavioral modification therapy today.  ASSESS: Johnsie has the diagnosis of obesity and her BMI today is 43.94 Sydney Dominguez is in the action stage of change   ADVISE: Sydney Dominguez was educated on the multiple health risks of obesity as well as the benefit of weight loss to improve her health. She was advised of the need for long term treatment and the importance of lifestyle modifications.  AGREE: Multiple dietary modification options and treatment options were discussed and  Sydney Dominguez agreed to the above obesity treatment plan.  I, Burt Knack, am acting as transcriptionist for Quillian Quince, MD  I have reviewed the above documentation for accuracy and completeness, and I agree with the above. -Quillian Quince, MD

## 2017-11-29 ENCOUNTER — Ambulatory Visit (INDEPENDENT_AMBULATORY_CARE_PROVIDER_SITE_OTHER): Payer: No Typology Code available for payment source | Admitting: Psychology

## 2017-11-29 ENCOUNTER — Ambulatory Visit (INDEPENDENT_AMBULATORY_CARE_PROVIDER_SITE_OTHER): Payer: Self-pay | Admitting: Physician Assistant

## 2017-11-29 ENCOUNTER — Encounter (INDEPENDENT_AMBULATORY_CARE_PROVIDER_SITE_OTHER): Payer: Self-pay

## 2017-12-14 ENCOUNTER — Ambulatory Visit (INDEPENDENT_AMBULATORY_CARE_PROVIDER_SITE_OTHER): Payer: Self-pay | Admitting: Physician Assistant

## 2017-12-14 ENCOUNTER — Encounter (INDEPENDENT_AMBULATORY_CARE_PROVIDER_SITE_OTHER): Payer: Self-pay

## 2017-12-14 ENCOUNTER — Ambulatory Visit (INDEPENDENT_AMBULATORY_CARE_PROVIDER_SITE_OTHER): Payer: Self-pay | Admitting: Psychology

## 2017-12-31 ENCOUNTER — Other Ambulatory Visit (INDEPENDENT_AMBULATORY_CARE_PROVIDER_SITE_OTHER): Payer: Self-pay | Admitting: Family Medicine

## 2017-12-31 DIAGNOSIS — E559 Vitamin D deficiency, unspecified: Secondary | ICD-10-CM

## 2017-12-31 DIAGNOSIS — R7303 Prediabetes: Secondary | ICD-10-CM

## 2018-01-02 ENCOUNTER — Ambulatory Visit (INDEPENDENT_AMBULATORY_CARE_PROVIDER_SITE_OTHER): Payer: Self-pay | Admitting: Psychology

## 2018-01-02 ENCOUNTER — Ambulatory Visit (INDEPENDENT_AMBULATORY_CARE_PROVIDER_SITE_OTHER): Payer: Self-pay | Admitting: Physician Assistant

## 2018-01-02 MED ORDER — VITAMIN D (ERGOCALCIFEROL) 1.25 MG (50000 UNIT) PO CAPS: 50000.0000 [IU] | ORAL_CAPSULE | ORAL | 0 refills | Status: DC

## 2018-01-02 MED ORDER — METFORMIN HCL 500 MG PO TABS: 500.0000 mg | ORAL_TABLET | Freq: Every day | ORAL | 0 refills | Status: DC

## 2018-01-10 ENCOUNTER — Telehealth (INDEPENDENT_AMBULATORY_CARE_PROVIDER_SITE_OTHER): Payer: Self-pay | Admitting: Family Medicine

## 2018-01-10 NOTE — Telephone Encounter (Signed)
[facility] fax # [phone]. drh

## 2018-01-12 ENCOUNTER — Encounter: Payer: Self-pay | Admitting: Student in an Organized Health Care Education/Training Program

## 2018-01-12 NOTE — Telephone Encounter (Signed)
Pt will be dropping off forms for the doctor to fill so that she can also get a referral to [facility] for bariatric surgery. jw

## 2018-01-13 ENCOUNTER — Other Ambulatory Visit: Payer: Self-pay | Admitting: Student in an Organized Health Care Education/Training Program

## 2018-01-13 DIAGNOSIS — Z7189 Other specified counseling: Secondary | ICD-10-CM

## 2018-01-13 NOTE — Progress Notes (Signed)
Patient interested in bariatric surgery. [facility] been [facility]. Referral placed to surgery at patient's request.

## 2018-01-16 ENCOUNTER — Telehealth: Payer: Self-pay | Admitting: Student in an Organized Health Care Education/Training Program

## 2018-01-16 ENCOUNTER — Other Ambulatory Visit: Payer: Self-pay | Admitting: Student in an Organized Health Care Education/Training Program

## 2018-01-16 NOTE — Telephone Encounter (Signed)
Clinical info completed on [facility] form.  Place form in Dr. Latanya Maudlin box for completion.  Lamonte Sakai, April D, [place]

## 2018-01-16 NOTE — Telephone Encounter (Signed)
Necessary info for [facility] form dropped off at front desk for completion.  Verified that patient section of form has been completed.  Last DOS/WCC with PCP was 06/29/2017.  Placed form in team folder to be completed by clinical staff.  Herma Mering [place]

## 2018-01-16 NOTE — Telephone Encounter (Signed)
Notes sent to the practice. April, CMA

## 2018-01-18 ENCOUNTER — Other Ambulatory Visit (INDEPENDENT_AMBULATORY_CARE_PROVIDER_SITE_OTHER): Payer: Self-pay | Admitting: Bariatrics

## 2018-01-18 ENCOUNTER — Other Ambulatory Visit: Payer: Self-pay | Admitting: Family Medicine

## 2018-01-18 DIAGNOSIS — E559 Vitamin D deficiency, unspecified: Secondary | ICD-10-CM

## 2018-01-18 DIAGNOSIS — F319 Bipolar disorder, unspecified: Secondary | ICD-10-CM

## 2018-01-18 MED ORDER — LAMOTRIGINE 200 MG PO TABS: 200.0000 mg | ORAL_TABLET | Freq: Every day | ORAL | 0 refills | Status: DC

## 2018-01-18 MED FILL — lamoTRIgine 200 MG TABS: 200 | 90 days supply | Qty: 90 | Fill #0

## 2018-01-18 NOTE — Progress Notes (Signed)
Office: [phone]  /  Fax: [phone]   Date: January 24, 2018 Time Seen: 9:02am Duration: 30 minutes Provider: Lawerance Cruel, Psy.D. Type of Session: Individual Therapy   HPI: Sydney Dominguez was referred by Dr. Quillian Quince and was seen by this provider for an initial appointment on October 05, 2017. Her modified PHQ-9 score at her visit on September 20, 2017 with Dr. Dalbert Garnet was 25. During the initial appointment with this provider, Sydney Dominguez shared she has Binge Eating Disorder and noted, "I've been doing it since I was [age] years old." Sydney Dominguez shared Binge Eating Disorder has never been "formally diagnosed" by a doctor as she "always hid it." She shared she uses "food as an outlet," craves carbohydrates, eats when she is stressed, and wakes up at night to eat. She also eats when experiencing other emotions, such as "happy" and "angry." Sydney Dominguez also shared she eats when dealing with her family as they often sabotage her. Currently, Sydney Dominguez attempts portion control. Sydney Dominguez reported she last binged the night before the initial appointment with this provider. She indicated she made spaghetti with veggie crumbles. She also ate two peanut butter and jelly sandwiches, fruit, and an additional sandwich. Sydney Dominguez reported she ate two other things, but could not recall what she ate. The aforementioned was consumed over the course of an hour. She indicated she binges every day. Sydney Dominguez described feeling "nasty" afterward and feels she has done something wrong. She noted she sometimes becomes tearful. Sydney Dominguez noted she does not fully chew and does not enjoy her food. She eats to satisfy a craving or urge. Per [name], she binges two to three times a day and is triggered by her family. After "cutting them off," family stress has decreased. Currently, Sydney Dominguez also experiences racing thoughts, increased energy, and social withdrawal. Two days ago, she noted she was really down and it made her feel "sluggish"  and hopeless." In addition, Sydney Dominguez reported a desire for bariatric surgery, but noted she plans to improve her eating habits and reduce binge eating prior to the surgery. Moreover, she expressed a desire to be healthy, live longer, and be around to see her daughter graduate. She noted, "I'm taking control."  Session Content: Session focused on the following treatment goals: reducing binge and emotional eating and increasing coping skills. The session was initiated with the administration of the PHQ-9 and GAD-7, as well as a brief check-in. Information concerning the practice, financial arrangements, and confidentiality and patients' rights were discussed during the initial appointment; however, per this provider's new office policy, Sydney Dominguez was asked to sign a service agreement related to the aforementioned. The agreement was reviewed with, and signed by, Sydney Dominguez. At this time, Sydney Dominguez declined to notate an emergency contact on the service agreement.  Sydney Dominguez reported she has not attended appointments consistently due to financial stressors. She explained she has been "focused" on journaling and lost six pounds since the last appointment. Sydney Dominguez denied binge eating and emotional eating episodes since the last appointment with this provider; however, acknowledged that stress related to work, family, and finances are triggers for her. She attributed not engaging in binge and emotional eating to journaling, and engaging in positive self-talk. Regarding mood, Sydney Dominguez denied any changes and noted, "Everything has been the same." She reported, "The Lamictal has been helping a lot." Session then focused on discussing the importance of consistency in treatment. Sydney Dominguez acknowledged understanding and reported that she would be able to be consistent moving forward. Sydney Dominguez of session focused on thought defusion.  Psychoeducation was provided and Sydney Dominguez was led through an exercise. She was also provided with a  handout with various exercises to practice between now and the next appointment. Overall, Sydney Dominguez was receptive to today's appointment as evidenced by her openness to sharing, responsiveness to feedback, and engagement in a thought defusion exercise.  Mental Status Examination: Sydney Dominguez arrived on time for the appointment; however, the appointment was initiated a couple minutes late due to this provider. She presented as appropriately dressed and groomed. Sydney Dominguez appeared her stated age and demonstrated adequate orientation to time, place, person, and purpose of the appointment. She also demonstrated appropriate eye contact. No psychomotor abnormalities or behavioral peculiarities noted. Her mood was euthymic with congruent affect. Her thought processes were logical, linear, and goal-directed. No hallucinations, delusions, bizarre thinking or behavior reported or observed. Judgment, insight, and impulse control appeared to be grossly intact. There was no evidence of paraphasias (i.e., errors in speech, gross mispronunciations, and word substitutions), repetition deficits, or disturbances in volume or prosody (i.e., rhythm and intonation). There was no evidence of attention or memory impairments. Sydney Dominguez denied current suicidal and homicidal ideation, intent or plan.  Structured Assessment Results: The Patient Health Questionnaire-9 (PHQ-9) is a self-report measure that assesses symptoms and severity of depression over the course of the last two weeks. Sydney Dominguez obtained a score of one suggesting minimal depression. Sydney Dominguez finds the endorsed symptoms to be not difficult at all. Depression screen PHQ 2/9 01/24/2018  Decreased Interest 0  Down, Depressed, Hopeless 0  PHQ - 2 Score 0  Altered sleeping 0  Tired, decreased energy 0  Change in appetite 0  Feeling bad or failure about yourself  0  Trouble concentrating 1  Moving slowly or fidgety/restless 0  Suicidal thoughts 0  PHQ-9 Score 1  Difficult  doing work/chores -   The Generalized Anxiety Disorder-7 (GAD-7) is a brief self-report measure that assesses symptoms of anxiety over the course of the last two weeks. Sydney Dominguez obtained a score of one suggesting minimal anxiety. GAD 7 : Generalized Anxiety Score 01/24/2018  Nervous, Anxious, on Edge 0  Control/stop worrying 0  Worry too much - different things 1  Trouble relaxing 0  Restless 0  Easily annoyed or irritable 0  Afraid - awful might happen 0  Total GAD 7 Score 1  Anxiety Difficulty Not difficult at all   Interventions: Sydney Dominguez was administered the PHQ-9 and GAD-7 for symptom monitoring. Content from the last session was reviewed (I.e., triggers for emotional eating). Throughout today's session, empathic reflections and validation were provided. Psychoeducation regarding thought defusion was provided and she was led through an exercise.  DSM-5 Diagnoses: 307.51 (F50.8) Binge-Eating Disorder, Extreme and 296.89 (F31.81) Bipolar II Disorder (by history)    Treatment Goal & Progress: Nashayla was seen for an initial appointment with this provider on October 05, 2017 during which the following treatment goals were established: reducing binge and emotional eating and increasing coping skills. Taneka has demonstrated progress in her goal of reducing binge and emotional eating as evidenced by her self report that neither has occurred since the last appointment with this provider. She also acknowledged her triggers for binge and emotional eating. As it relates to her goal of increasing coping skills, Talesha has demonstrated willingness to practice thought defusion between today and the next appointment.  Plan: Tammye continues to appear able and willing to participate as evidenced by engagement in reciprocal conversation, and asking questions for clarification as appropriate.The next appointment will be scheduled in two  weeks. The next session will focus on thought defusion further and  continuing to work towards her established treatment goals

## 2018-01-23 NOTE — Telephone Encounter (Signed)
Made copy of form for batch scanning and placed original in to be faxed pile. Retia Cordle, Maryjo Rochester, CMA

## 2018-01-23 NOTE — Telephone Encounter (Signed)
Completed and placed in nurse basket

## 2018-01-24 ENCOUNTER — Ambulatory Visit (INDEPENDENT_AMBULATORY_CARE_PROVIDER_SITE_OTHER): Payer: No Typology Code available for payment source | Admitting: Psychology

## 2018-01-24 ENCOUNTER — Ambulatory Visit (INDEPENDENT_AMBULATORY_CARE_PROVIDER_SITE_OTHER): Payer: No Typology Code available for payment source | Admitting: Physician Assistant

## 2018-01-24 VITALS — BP 112/74 | HR 76 | Temp 97.8°F | Ht 63.0 in | Wt 242.0 lb

## 2018-01-24 DIAGNOSIS — F5081 Binge eating disorder: Secondary | ICD-10-CM | POA: Diagnosis not present

## 2018-01-24 DIAGNOSIS — Z9189 Other specified personal risk factors, not elsewhere classified: Secondary | ICD-10-CM

## 2018-01-24 DIAGNOSIS — R7303 Prediabetes: Secondary | ICD-10-CM

## 2018-01-24 DIAGNOSIS — Z6841 Body Mass Index (BMI) 40.0 and over, adult: Secondary | ICD-10-CM | POA: Diagnosis not present

## 2018-01-24 DIAGNOSIS — E559 Vitamin D deficiency, unspecified: Secondary | ICD-10-CM

## 2018-01-24 MED ORDER — VITAMIN D (ERGOCALCIFEROL) 1.25 MG (50000 UNIT) PO CAPS: 50000.0000 [IU] | ORAL_CAPSULE | ORAL | 0 refills | Status: DC

## 2018-01-24 MED FILL — VIT D2 1.25 MG (50,000 UNIT: 1.25 MG | 28 days supply | Qty: 4 | Fill #0

## 2018-01-25 NOTE — Progress Notes (Signed)
Office: [phone]  /  Fax: [phone]   HPI:   Chief Complaint: OBESITY Etherine is here to discuss her progress with her obesity treatment plan. She is on the lower carbohydrate, vegetable and lean protein rich diet plan and is following her eating plan approximately 90 % of the time. She states she is walking for 30 minutes 4 times per week. Delesha did very well with weight loss. She reports alternating between pescatarian and low carbohydrate plan. She is getting bored and wants to try something different.  Her weight is 242 lb (109.8 kg) today and has had a weight loss of 6 pounds over a period of 10 weeks since her last visit. She has lost 12 lbs since starting treatment with [place].  Vitamin D Deficiency Ivyrose has a diagnosis of vitamin D deficiency. She is on prescription Vit D and denies nausea, vomiting or muscle weakness.  Pre-Diabetes Lyanne has a diagnosis of pre-diabetes based on her elevated Hgb A1c and was informed this puts her at greater risk of developing diabetes. She is no longer on metformin, stopped on her own due to nausea and she denies polyphagia or hypoglycemia. She continues to work on diet and exercise to decrease risk of diabetes.   At risk for diabetes Rogena is at higher than average risk for developing diabetes due to her obesity and pre-diabetes. She currently denies polyuria or polydipsia.  ALLERGIES: No Known Allergies  MEDICATIONS: Current Outpatient Medications on File Prior to Visit  Medication Sig Dispense Refill  . lamoTRIgine (LAMICTAL) 200 MG tablet Take 1 tablet (200 mg total) by mouth daily. 90 tablet 0  . Spirulina POWD 5 mg by Does not apply route 3 (three) times daily.     No current facility-administered medications on file prior to visit.     PAST MEDICAL HISTORY: Past Medical History:  Diagnosis Date  . Anemia   . Anxiety   . Asthma   . B12 deficiency   . Back pain   . Bipolar disorder (HCC)   . Chest pain   .  Constipation   . Depression   . Dyspnea   . GERD (gastroesophageal reflux disease)   . Palpitations   . Prediabetes   . Schizophrenia (HCC)   . Vertigo   . Vitamin D deficiency     PAST SURGICAL HISTORY: Past Surgical History:  Procedure Laterality Date  . HAND SURGERY     l hand    SOCIAL HISTORY: Social History   Tobacco Use  . Smoking status: Never Smoker  . Smokeless tobacco: Never Used  Substance Use Topics  . Alcohol use: No    Alcohol/week: 0.0 standard drinks  . Drug use: No    FAMILY HISTORY: Family History  Problem Relation Age of Onset  . Diabetes Mother   . Hypertension Mother   . Thyroid disease Mother   . Depression Mother   . Anxiety disorder Mother   . Obesity Mother   . Hypertension Father   . Hyperlipidemia Father   . Depression Father   . Anxiety disorder Father   . Bipolar disorder Father   . Schizophrenia Father   . Alcoholism Father   . Eating disorder Father   . Hypertension Maternal Grandmother   . Cancer Maternal Grandfather        prostate  . Anesthesia problems Neg Hx     ROS: Review of Systems  Constitutional: Positive for weight loss.  Gastrointestinal: Negative for nausea and vomiting.  Genitourinary: Negative  for frequency.  Musculoskeletal:       Negative muscle weakness  Endo/Heme/Allergies: Negative for polydipsia.       Negative polyphagia Negative hypoglycemia    PHYSICAL EXAM: Blood pressure 112/74, pulse 76, temperature 97.8 F (36.6 C), temperature source Oral, height 5\' 3"  (1.6 m), weight 242 lb (109.8 kg), SpO2 99 %. Body mass index is 42.87 kg/m. Physical Exam  Constitutional: She is oriented to person, place, and time. She appears well-developed and well-nourished.  Cardiovascular: Normal rate.  Pulmonary/Chest: Effort normal.  Musculoskeletal: Normal range of motion.  Neurological: She is oriented to person, place, and time.  Skin: Skin is warm and dry.  Psychiatric: She has a normal mood and  affect. Her behavior is normal.  Vitals reviewed.   RECENT LABS AND TESTS: BMET    Component Value Date/Time   NA 140 09/20/2017 1033   K 4.0 09/20/2017 1033   CL 102 09/20/2017 1033   CO2 23 09/20/2017 1033   GLUCOSE 85 09/20/2017 1033   GLUCOSE 94 09/20/2012 1400   BUN 4 (L) 09/20/2017 1033   CREATININE 0.76 09/20/2017 1033   CALCIUM 9.5 09/20/2017 1033   GFRNONAA 107 09/20/2017 1033   GFRAA 123 09/20/2017 1033   Lab Results  Component Value Date   HGBA1C 5.5 09/20/2017   Lab Results  Component Value Date   INSULIN 16.9 09/20/2017   CBC    Component Value Date/Time   WBC 5.2 09/20/2017 1033   WBC 7.8 09/20/2012 1400   RBC 4.58 09/20/2017 1033   RBC 4.79 09/20/2012 1400   HGB 11.8 09/20/2017 1033   HCT 36.7 09/20/2017 1033   PLT 371 09/20/2012 1400   MCV 80 09/20/2017 1033   MCH 25.8 (L) 09/20/2017 1033   MCH 28.4 09/20/2012 1400   MCHC 32.2 09/20/2017 1033   MCHC 34.2 09/20/2012 1400   RDW 16.8 (H) 09/20/2017 1033   LYMPHSABS 1.2 09/20/2017 1033   MONOABS 0.6 09/20/2012 1400   EOSABS 0.1 09/20/2017 1033   BASOSABS 0.0 09/20/2017 1033   Iron/TIBC/Ferritin/ %Sat No results found for: IRON, TIBC, FERRITIN, IRONPCTSAT Lipid Panel     Component Value Date/Time   CHOL 183 09/20/2017 1033   TRIG 63 09/20/2017 1033   HDL 58 09/20/2017 1033   LDLCALC 112 (H) 09/20/2017 1033   Hepatic Function Panel     Component Value Date/Time   PROT 6.4 09/20/2017 1033   ALBUMIN 4.3 09/20/2017 1033   AST 14 09/20/2017 1033   ALT 13 09/20/2017 1033   ALKPHOS 52 09/20/2017 1033   BILITOT <0.2 09/20/2017 1033      Component Value Date/Time   TSH 0.794 09/20/2017 1033  Results for VALREE, FEILD (MRN [id number]) as of 01/25/2018 14:07  Ref. Range 09/20/2017 10:33  Vitamin D, 25-Hydroxy Latest Ref Range: 30.0 - 100.0 ng/mL 19.9 (L)    ASSESSMENT AND PLAN: Vitamin D deficiency - Plan: Vitamin D, Ergocalciferol, (DRISDOL) 50000 units CAPS capsule  Prediabetes  At  risk for diabetes mellitus  Class 3 severe obesity with serious comorbidity and body mass index (BMI) of 40.0 to 44.9 in adult, unspecified obesity type (HCC)  PLAN:  Vitamin D Deficiency Zelia was informed that low vitamin D levels contributes to fatigue and are associated with obesity, breast, and colon cancer. Iwalani agrees to continue taking prescription Vit D @50 ,000 IU every week #4 and we will refill for 1 month. She will follow up for routine testing of vitamin D, at least 2-3 times per year. She  was informed of the risk of over-replacement of vitamin D and agrees to not increase her dose unless she discusses this with [place] first. We will recheck labs at next visit. Taralyn agrees to follow up with our clinic in 4 weeks.  Pre-Diabetes Chaela will continue to work on weight loss, diet, exercise, and decreasing simple carbohydrates in her diet to help decrease the risk of diabetes. We dicussed metformin including benefits and risks. She was informed that eating too many simple carbohydrates or too many calories at one sitting increases the likelihood of GI side effects. Enez discontinued metformin and a prescription was not written today. Fortunata agrees to follow up with our clinic in 4 weeks as directed to monitor her progress.  Diabetes risk counselling Ozella was given extended (15 minutes) diabetes prevention counseling today. She is 27 y.o. female and has risk factors for diabetes including obesity and pre-diabetes. We discussed intensive lifestyle modifications today with an emphasis on weight loss as well as increasing exercise and decreasing simple carbohydrates in her diet.  Obesity Onica is currently in the action stage of change. As such, her goal is to continue with weight loss efforts She has agreed to follow the Category 2 plan Caitlyne has been instructed to work up to a goal of 150 minutes of combined cardio and strengthening exercise per week for weight loss  and overall health benefits. We discussed the following Behavioral Modification Strategies today: work on meal planning and easy cooking plans and keeping healthy foods in the home   Adellyn has agreed to follow up with our clinic in 4 weeks. She was informed of the importance of frequent follow up visits to maximize her success with intensive lifestyle modifications for her multiple health conditions.   OBESITY BEHAVIORAL INTERVENTION VISIT  Today's visit was # 4   Starting weight: 254 lbs Starting date: 09/20/17 Today's weight : 242 lbs Today's date: 01/24/2018 Total lbs lost to date: 12    ASK: We discussed the diagnosis of obesity with Rebecca Eaton today and Caroleena agreed to give [place] permission to discuss obesity behavioral modification therapy today.  ASSESS: Ceaira has the diagnosis of obesity and her BMI today is 42.88 Opha is in the action stage of change   ADVISE: Kamyra was educated on the multiple health risks of obesity as well as the benefit of weight loss to improve her health. She was advised of the need for long term treatment and the importance of lifestyle modifications.  AGREE: Multiple dietary modification options and treatment options were discussed and  Stashia agreed to the above obesity treatment plan.  Trude Mcburney, am acting as transcriptionist for Alois Cliche, PA-C I, Alois Cliche, PA-C have reviewed above note and agree with its content

## 2018-02-01 ENCOUNTER — Encounter: Payer: Self-pay | Admitting: Student in an Organized Health Care Education/Training Program

## 2018-02-01 ENCOUNTER — Other Ambulatory Visit: Payer: Self-pay

## 2018-02-01 ENCOUNTER — Ambulatory Visit (INDEPENDENT_AMBULATORY_CARE_PROVIDER_SITE_OTHER)
Payer: No Typology Code available for payment source | Admitting: Student in an Organized Health Care Education/Training Program

## 2018-02-01 VITALS — BP 116/86 | HR 79 | Temp 98.3°F | Ht 63.0 in | Wt 253.6 lb

## 2018-02-01 DIAGNOSIS — Z7189 Other specified counseling: Secondary | ICD-10-CM

## 2018-02-01 NOTE — Patient Instructions (Signed)
It was a pleasure seeing you today in our clinic. Here is the treatment plan we have discussed and agreed upon together:  A consult was placed to surgery at today's visit.  You will receive a call to schedule an appointment. If you do not receive a call within two weeks please call our office so we can place the consult again.  Our clinic's number is [phone]. Please call with questions or concerns about what we discussed today.  Be well, Dr. Mosetta Putt

## 2018-02-01 NOTE — Progress Notes (Signed)
   Subjective:    Sydney Dominguez - 27 y.o. female MRN [id number]  Date of birth: 07-06-1990  HPI  Sydney Dominguez is here for a referral to general surgery to undergo bariatric surgery.  Patient reports she has been working with healthy weight and wellness to improve her fitness, healthy lifestyle and weight. She is very motivated to undergo gastric bypass surgery. She has attended one of the information seminars and asks that we place a referral to bariatric surgery today.   Health Maintenance:  There are no preventive care reminders to display for this patient.  -  reports that she has never smoked. She has never used smokeless tobacco. - Review of Systems: Per HPI. - Past Medical History: Patient Active Problem List   Diagnosis Date Noted  . Other fatigue 09/20/2017  . Shortness of breath on exertion 09/20/2017  . Prediabetes 09/20/2017  . Vitamin D deficiency 09/20/2017  . Binge eating 06/29/2017  . Severe obesity (BMI >= 40) (HCC) 06/29/2017  . Healthcare maintenance 05/17/2017  . Yeast infection 05/17/2017  . Bipolar depression (HCC) 04/28/2017   - Medications: reviewed and updated Current Outpatient Medications  Medication Sig Dispense Refill  . lamoTRIgine (LAMICTAL) 200 MG tablet Take 1 tablet (200 mg total) by mouth daily. 90 tablet 0  . Spirulina POWD 5 mg by Does not apply route 3 (three) times daily.    . Vitamin D, Ergocalciferol, (DRISDOL) 50000 units CAPS capsule Take 1 capsule (50,000 Units total) by mouth every 7 (seven) days. 4 capsule 0   No current facility-administered medications for this visit.     Review of Systems See HPI     Objective:   Physical Exam BP 116/86   Pulse 79   Temp 98.3 F (36.8 C) (Oral)   Ht 5\' 3"  (1.6 m)   Wt 253 lb 9.6 oz (115 kg)   LMP 01/13/2018 (Exact Date)   SpO2 99%   BMI 44.92 kg/m  GEN: NAD, alert, cooperative, and pleasant. RESPIRATORY: Comfortable work of breathing, speaks in full sentences CV: Regular rate  noted, distal extremities well perfused and warm without edema GI: Soft, nondistended SKIN: warm and dry, no rashes or lesions NEURO: II-XII grossly intact MSK: Moves 4 extremities equally PSYCH: AAOx3, appropriate affect      Assessment & Plan:   1. Obesity, BMI 44.92 - will place referral to [facility] [place] [facility] to start process for weight loss surgery. She has already attended the seminar. She reports that she is highly motivated to put in the work before and after her surgery to maintain a healthy weight.  Howard Pouch, MD,MS,  PGY3 02/01/2018 1:36 PM

## 2018-02-06 ENCOUNTER — Ambulatory Visit (INDEPENDENT_AMBULATORY_CARE_PROVIDER_SITE_OTHER): Payer: No Typology Code available for payment source | Admitting: Psychology

## 2018-02-06 DIAGNOSIS — F5081 Binge eating disorder: Secondary | ICD-10-CM | POA: Diagnosis not present

## 2018-02-06 NOTE — Progress Notes (Signed)
Office: [phone]  /  Fax: [phone]   Date: February 06, 2018   Time Seen: 8:00am Duration: 30 minutes Provider: Lawerance Cruel, Psy.D. Type of Session: Individual Therapy   HPI: [name] referred by Dr. Quillian Quince and was seen by this provider for an initial appointment on October 05, 2017. Her modified PHQ-9 score at her visit on September 20, 2017 with Dr. Dalbert Garnet was 25. During the initial appointment with this provider, Sydney Dominguez shared she has Binge Eating Disorder and noted, "I've been doing it since I was [age] years old." Sydney Dominguez shared Binge Eating Disorder has never been "formally diagnosed" by a doctor as she "always hid it." She shared she uses "food as an outlet," craves carbohydrates, eats when she is stressed, and wakes up at night to eat. She also eats when experiencing other emotions, such as "happy" and "angry." Sydney Dominguez also shared she eats when dealing with her family as they often sabotage her. Currently, Sydney Dominguez attempts portion control. Sydney Dominguez reported she last binged the night before the initial appointment with this provider. She indicated she made spaghetti with veggie crumbles. She also ate two peanut butter and jelly sandwiches, fruit, and an additional sandwich. Sydney Dominguez reported she ate two other things, but could not recall what she ate. The aforementioned was consumed over the course of an hour. She indicated she binges every day. Sydney Dominguez described feeling "nasty" afterward and feels she has done something wrong. She noted she sometimes becomes tearful. Sydney Dominguez noted she does not fully chew and does not enjoy her food. She eats to satisfy a craving or urge. Per Sydney Dominguez, she binges two to three times a day and is triggered by her family. After "cutting them off," family stress has decreased. Currently, Sydney Dominguez also experiences racing thoughts, increased energy, and social withdrawal. Two days ago, she noted she was really down and it made her feel  "sluggish" and hopeless." In addition, Sydney Dominguez reported a desire for bariatric surgery, but noted she plans to improve her eating habits and reduce binge eating prior to the surgery. Moreover, she expressed a desire to be healthy, live longer, and be around to see her daughter graduate. She noted, "I'm taking control."  Session Content: Session focused on the following treatment goals: reducing binge and emotional eating and increasing coping skills. The session was initiated with the administration of the PHQ-9 and GAD-7, as well as a brief check-in. Sydney Dominguez reported she continues to focus on portion control, journaling, and using learned skills. She indicated she engages in meal prepping and walks three times a week. Sydney Dominguez indicated practicing thought defusion since the last appointment and noted "it helped a whole lot." She denied engaging in emotional and binge eating since the last appointment. When asked about her thought defusion practice, it was determined Sydney Dominguez was engaging in positive self talk and cognitive restructuring to cope.  As such, today's session focused on providing her with further psychoeducation about thought defusion and leading her through an exercise. This provider also assisted her in understanding the difference. For the exercise, she identified and utilized the following thought, "I am not good enough."  Initially, she described feeling "real crappy", but was observed laughing as the exercise proceeded. Today's session also focused on briefly discussing termination planning, including a referral for longer-term services. She requested time to think about whether or not she would like to continue therapeutic services after termination with this provider.  Overall, Sydney Dominguez was receptive to today's appointment as evidenced by her openness to  sharing, responsiveness to feedback, and engagement in the thought defusion exercise.  Mental Status Examination: Sydney Dominguez arrived on  time for the appointment. She presented as appropriately dressed and groomed. Sydney Dominguez appeared her stated age and demonstrated adequate orientation to time, place, person, and purpose of the appointment. She also demonstrated appropriate eye contact. No psychomotor abnormalities or behavioral peculiarities noted. Her mood was euthymic with congruent affect. Her thought processes were logical, linear, and goal-directed. No hallucinations, delusions, bizarre thinking or behavior reported or observed. Judgment, insight, and impulse control appeared to be grossly intact. There was no evidence of paraphasias (i.e., errors in speech, gross mispronunciations, and word substitutions), repetition deficits, or disturbances in volume or prosody (i.e., rhythm and intonation). There was no evidence of attention or memory impairments. Sydney Dominguez denied current suicidal and homicidal ideation, intent or plan.  Structured Assessment Results: The Patient Health Questionnaire-9 (PHQ-9) is a self-report measure that assesses symptoms and severity of depression over the course of the last two weeks. Sydney Dominguez obtained a score of zero. Depression screen Sydney Dominguez 2/9 02/06/2018  Decreased Interest 0  Down, Depressed, Hopeless 0  PHQ - 2 Score 0  Altered sleeping 0  Tired, decreased energy 0  Change in appetite 0  Feeling bad or failure about yourself  0  Trouble concentrating 0  Moving slowly or fidgety/restless 0  Suicidal thoughts 0  PHQ-9 Score 0  Difficult doing work/chores -   The Generalized Anxiety Disorder-7 (GAD-7) is a brief self-report measure that assesses symptoms of anxiety over the course of the last two weeks. Sydney Dominguez obtained a score of two suggesting minimal anxiety. GAD 7 : Generalized Anxiety Score 02/06/2018  Nervous, Anxious, on Edge 0  Control/stop worrying 1  Worry too much - different things 1  Trouble relaxing 0  Restless 0  Easily annoyed or irritable 0  Afraid - awful might happen 0  Total  GAD 7 Score 2  Anxiety Difficulty Not difficult at all   Interventions: Sydney Dominguez was administered the PHQ-9 and GAD-7 for symptom monitoring. Content from the last session was reviewed. Throughout today's session, empathic reflections and validation were provided. Further psychoeducation regarding thought defusion was provided and Sydney Dominguez was led through an exercise. In addition, this provider explained the difference between thought defusion, positive self talk, and cognitive restructuring.  DSM-5 Diagnoses: 307.51 (F50.8) Binge-Eating Disorder, Extreme and 296.89 (F31.81) Bipolar II Disorder (by history)  Treatment Goal & Progress: Sydney Dominguez was seen for an initial appointment with this provider on October 05, 2017 during which the following treatment goal was established: reduce binge and emotional eating and increase coping skills. Verne has demonstrated progress in her goal of reducing binge and emotional eating as evidenced by her awareness of hunger patterns and triggers for emotional eating. In addition, she continues to report she has not engaged in emotional and binge eating. She has demonstrated progress in her goal of improving coping skills as evidenced by her self-report of engaging in cognitive coping skills.  Plan: Yavonne continues to appear able and willing to participate as evidenced by engagement in reciprocal conversation, and asking questions for clarification as appropriate. The next appointment will be scheduled in two weeks. The next session will focus on reviewing thought defusion and the introduction of mindfulness as a coping skill to assist with established goals.
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
## 2018-02-20 ENCOUNTER — Ambulatory Visit (INDEPENDENT_AMBULATORY_CARE_PROVIDER_SITE_OTHER): Payer: No Typology Code available for payment source | Admitting: Physician Assistant

## 2018-02-20 ENCOUNTER — Encounter (INDEPENDENT_AMBULATORY_CARE_PROVIDER_SITE_OTHER): Payer: Self-pay | Admitting: Physician Assistant

## 2018-02-20 VITALS — BP 129/74 | HR 71 | Temp 98.8°F | Ht 63.0 in | Wt 243.0 lb

## 2018-02-20 DIAGNOSIS — Z9189 Other specified personal risk factors, not elsewhere classified: Secondary | ICD-10-CM

## 2018-02-20 DIAGNOSIS — E8881 Metabolic syndrome: Secondary | ICD-10-CM

## 2018-02-20 DIAGNOSIS — Z6841 Body Mass Index (BMI) 40.0 and over, adult: Secondary | ICD-10-CM

## 2018-02-20 DIAGNOSIS — E559 Vitamin D deficiency, unspecified: Secondary | ICD-10-CM

## 2018-02-20 DIAGNOSIS — E66813 Obesity, class 3: Secondary | ICD-10-CM

## 2018-02-20 DIAGNOSIS — E7849 Other hyperlipidemia: Secondary | ICD-10-CM

## 2018-02-20 DIAGNOSIS — E88819 Insulin resistance, unspecified: Secondary | ICD-10-CM

## 2018-02-20 MED ORDER — VITAMIN D (ERGOCALCIFEROL) 1.25 MG (50000 UNIT) PO CAPS: 50000.0000 [IU] | ORAL_CAPSULE | ORAL | 0 refills | Status: DC

## 2018-02-20 MED ORDER — METFORMIN HCL 500 MG PO TABS: 500.0000 mg | ORAL_TABLET | Freq: Every day | ORAL | 0 refills | Status: DC

## 2018-02-20 NOTE — Progress Notes (Signed)
Office: [phone]  /  Fax: [phone]   HPI:   Chief Complaint: OBESITY Sydney Dominguez is here to discuss her progress with her obesity treatment plan. She is on the  follow the Category 2 plan and is following her eating plan approximately 85 % of the time. She states she is exercising 0 minutes 0 times per week. Sydney Dominguez reports that she has been having cravings due to her menstrual cycle. She is able to eat all of the food on the plan.  Her weight is 243 lb (110.2 kg) today and has had a weight gain of 1 poundover a period of 4 weeks since her last visit. She has lost 11 lbs since starting treatment with [place].  Insulin Resistance Sydney Dominguez has a diagnosis of insulin resistance based on her elevated fasting insulin level >5. Her last level was not at goal. Although Sydney Dominguez's blood glucose readings are still under good control, insulin resistance puts her at greater risk of metabolic syndrome and diabetes. She is taking metformin currently and continues to work on diet and exercise to decrease risk of diabetes. She reports cravings in the afternoon and evenings.   Vitamin D deficiency Sydney Dominguez has a diagnosis of vitamin D deficiency. She is currently taking vit D and denies nausea, vomiting or muscle weakness.  Ref. Range 09/20/2017 10:33  Vitamin D, 25-Hydroxy Latest Ref Range: 30.0 - 100.0 ng/mL 19.9 (L)   Hyperlipidemia Sydney Dominguez has hyperlipidemia and has been trying to improve her cholesterol levels with intensive lifestyle modification including a low saturated fat diet, exercise and weight loss. She denies any chest pain, claudication or myalgias. She is not currently on any medications.   At risk for osteopenia and osteoporosis Sydney Dominguez is at higher risk of osteopenia and osteoporosis due to vitamin D deficiency.   ALLERGIES: No Known Allergies  MEDICATIONS: Current Outpatient Medications on File Prior to Visit  Medication Sig Dispense Refill  . lamoTRIgine (LAMICTAL) 200 MG  tablet Take 1 tablet (200 mg total) by mouth daily. 90 tablet 0  . Spirulina POWD 5 mg by Does not apply route 3 (three) times daily.     No current facility-administered medications on file prior to visit.     PAST MEDICAL HISTORY: Past Medical History:  Diagnosis Date  . Anemia   . Anxiety   . Asthma   . B12 deficiency   . Back pain   . Bipolar disorder (HCC)   . Chest pain   . Constipation   . Depression   . Dyspnea   . GERD (gastroesophageal reflux disease)   . Palpitations   . Prediabetes   . Schizophrenia (HCC)   . Vertigo   . Vitamin D deficiency     PAST SURGICAL HISTORY: Past Surgical History:  Procedure Laterality Date  . HAND SURGERY     l hand    SOCIAL HISTORY: Social History   Tobacco Use  . Smoking status: Never Smoker  . Smokeless tobacco: Never Used  Substance Use Topics  . Alcohol use: No    Alcohol/week: 0.0 standard drinks  . Drug use: No    FAMILY HISTORY: Family History  Problem Relation Age of Onset  . Diabetes Mother   . Hypertension Mother   . Thyroid disease Mother   . Depression Mother   . Anxiety disorder Mother   . Obesity Mother   . Hypertension Father   . Hyperlipidemia Father   . Depression Father   . Anxiety disorder Father   . Bipolar disorder  Father   . Schizophrenia Father   . Alcoholism Father   . Eating disorder Father   . Hypertension Maternal Grandmother   . Cancer Maternal Grandfather        prostate  . Anesthesia problems Neg Hx     ROS: Review of Systems  Constitutional: Negative for weight loss.  Cardiovascular: Negative for chest pain and claudication.  Gastrointestinal: Negative for nausea and vomiting.  Musculoskeletal: Negative for myalgias.       Negative for muscle weakness    PHYSICAL EXAM: Blood pressure 129/74, pulse 71, temperature 98.8 F (37.1 C), temperature source Oral, height 5\' 3"  (1.6 m), weight 243 lb (110.2 kg), SpO2 100 %. Body mass index is 43.05 kg/m. Physical Exam    Constitutional: She is oriented to person, place, and time. She appears well-developed and well-nourished.  HENT:  Head: Normocephalic.  Neck: Normal range of motion.  Cardiovascular: Normal rate.  Pulmonary/Chest: Effort normal.  Musculoskeletal: Normal range of motion.  Neurological: She is alert and oriented to person, place, and time.  Skin: Skin is warm and dry.  Psychiatric: She has a normal mood and affect. Her behavior is normal.  Vitals reviewed.   RECENT LABS AND TESTS: BMET    Component Value Date/Time   NA 140 09/20/2017 1033   K 4.0 09/20/2017 1033   CL 102 09/20/2017 1033   CO2 23 09/20/2017 1033   GLUCOSE 85 09/20/2017 1033   GLUCOSE 94 09/20/2012 1400   BUN 4 (L) 09/20/2017 1033   CREATININE 0.76 09/20/2017 1033   CALCIUM 9.5 09/20/2017 1033   GFRNONAA 107 09/20/2017 1033   GFRAA 123 09/20/2017 1033   Lab Results  Component Value Date   HGBA1C 5.5 09/20/2017   Lab Results  Component Value Date   INSULIN 16.9 09/20/2017   CBC    Component Value Date/Time   WBC 5.2 09/20/2017 1033   WBC 7.8 09/20/2012 1400   RBC 4.58 09/20/2017 1033   RBC 4.79 09/20/2012 1400   HGB 11.8 09/20/2017 1033   HCT 36.7 09/20/2017 1033   PLT 371 09/20/2012 1400   MCV 80 09/20/2017 1033   MCH 25.8 (L) 09/20/2017 1033   MCH 28.4 09/20/2012 1400   MCHC 32.2 09/20/2017 1033   MCHC 34.2 09/20/2012 1400   RDW 16.8 (H) 09/20/2017 1033   LYMPHSABS 1.2 09/20/2017 1033   MONOABS 0.6 09/20/2012 1400   EOSABS 0.1 09/20/2017 1033   BASOSABS 0.0 09/20/2017 1033   Iron/TIBC/Ferritin/ %Sat No results found for: IRON, TIBC, FERRITIN, IRONPCTSAT Lipid Panel     Component Value Date/Time   CHOL 183 09/20/2017 1033   TRIG 63 09/20/2017 1033   HDL 58 09/20/2017 1033   LDLCALC 112 (H) 09/20/2017 1033   Hepatic Function Panel     Component Value Date/Time   PROT 6.4 09/20/2017 1033   ALBUMIN 4.3 09/20/2017 1033   AST 14 09/20/2017 1033   ALT 13 09/20/2017 1033   ALKPHOS 52  09/20/2017 1033   BILITOT <0.2 09/20/2017 1033      Component Value Date/Time   TSH 0.794 09/20/2017 1033    Ref. Range 09/20/2017 10:33  Vitamin D, 25-Hydroxy Latest Ref Range: 30.0 - 100.0 ng/mL 19.9 (L)   ASSESSMENT AND PLAN: Insulin resistance - Plan: Comprehensive metabolic panel, Hemoglobin A1c, Insulin, random, metFORMIN (GLUCOPHAGE) 500 MG tablet  Vitamin D deficiency - Plan: VITAMIN D 25 Hydroxy (Vit-D Deficiency, Fractures), Vitamin D, Ergocalciferol, (DRISDOL) 50000 units CAPS capsule  Other hyperlipidemia - Plan: Lipid Panel  With LDL/HDL Ratio  At risk for osteoporosis  Class 3 severe obesity with serious comorbidity and body mass index (BMI) of 40.0 to 44.9 in adult, unspecified obesity type (HCC)  PLAN: Insulin Resistance Tiane will continue to work on weight loss, exercise, and decreasing simple carbohydrates in her diet to help decrease the risk of diabetes. We dicussed metformin including benefits and risks. She was informed that eating too many simple carbohydrates or too many calories at one sitting increases the likelihood of GI side effects. Nikitia agrees to continue Metformin 500 mg daily #30 with no refills.  Chanti agreed to follow up with [place] as directed to monitor her progress. We will repeat labs today.   Vitamin D Deficiency Betsabe was informed that low vitamin D levels contributes to fatigue and are associated with obesity, breast, and colon cancer. She agrees to continue to take prescription Vit D @50 ,000 IU every week #4 with no refills and will follow up for routine testing of vitamin D, at least 2-3 times per year. She was informed of the risk of over-replacement of vitamin D and agrees to not increase her dose unless she discusses this with [place] first. Agrees to follow up with our clinic as directed. We will repeat labs today.   Hyperlipidemia Trenell was informed of the American Heart Association Guidelines emphasizing intensive lifestyle  modifications as the first line treatment for hyperlipidemia. We discussed many lifestyle modifications today in depth, and Jahni will continue to work on decreasing saturated fats such as fatty red meat, butter and many fried foods. She will also increase vegetables and lean protein in her diet and continue to work on exercise and weight loss efforts. We will repeat labs today.   Obesity Jamelia is currently in the action stage of change. As such, her goal is to continue with weight loss efforts She has agreed to follow the Category 2 plan Rielle has been instructed to work up to a goal of 150 minutes of combined cardio and strengthening exercise per week for weight loss and overall health benefits. We discussed the following Behavioral Modification Strategies today: work on meal planning and easy cooking plans and better snacking choices.    Linah has agreed to follow up with our clinic in 3 weeks. She was informed of the importance of frequent follow up visits to maximize her success with intensive lifestyle modifications for her multiple health conditions.   OBESITY BEHAVIORAL INTERVENTION VISIT  Today's visit was # 5   Starting weight: 254 lb Starting date: 09/20/17 Today's weight : 243 lb Today's date: 02/20/2018 Total lbs lost to date: 11 lb    ASK: We discussed the diagnosis of obesity with Rebecca Eaton today and Jhana agreed to give [place] permission to discuss obesity behavioral modification therapy today.  ASSESS: Gerline has the diagnosis of obesity and her BMI today is 43.06 Anette is in the action stage of change   ADVISE: Maleia was educated on the multiple health risks of obesity as well as the benefit of weight loss to improve her health. She was advised of the need for long term treatment and the importance of lifestyle modifications to improve her current health and to decrease her risk of future health problems.  AGREE: Multiple dietary  modification options and treatment options were discussed and  Khrystyne agreed to follow the recommendations documented in the above note.  ARRANGE: Leotta was educated on the importance of frequent visits to treat obesity as outlined per CMS and USPSTF guidelines  and agreed to schedule her next follow up appointment today.  Otis Peak, am acting as transcriptionist for [employer], PA-C  I, Alois Cliche, PA-C have reviewed above note and agree with its content

## 2018-02-21 ENCOUNTER — Ambulatory Visit (INDEPENDENT_AMBULATORY_CARE_PROVIDER_SITE_OTHER): Payer: No Typology Code available for payment source | Admitting: Psychology

## 2018-02-21 DIAGNOSIS — F5081 Binge eating disorder: Secondary | ICD-10-CM | POA: Diagnosis not present

## 2018-02-21 LAB — COMPREHENSIVE METABOLIC PANEL
ALBUMIN: 4.6 g/dL (ref 3.5–5.5)
ALK PHOS: 62 IU/L (ref 39–117)
ALT: 14 IU/L (ref 0–32)
AST: 13 IU/L (ref 0–40)
Albumin/Globulin Ratio: 1.9 (ref 1.2–2.2)
BUN / CREAT RATIO: 11 (ref 9–23)
BUN: 8 mg/dL (ref 6–20)
CHLORIDE: 104 mmol/L (ref 96–106)
CO2: 23 mmol/L (ref 20–29)
Calcium: 9.6 mg/dL (ref 8.7–10.2)
Creatinine, Ser: 0.74 mg/dL (ref 0.57–1.00)
GFR calc non Af Amer: 111 mL/min/{1.73_m2} (ref >59)
GFR, EST AFRICAN AMERICAN: 127 mL/min/{1.73_m2} (ref >59)
GLOBULIN, TOTAL: 2.4 g/dL (ref 1.5–4.5)
GLUCOSE: 82 mg/dL (ref 65–99)
Potassium: 4.3 mmol/L (ref 3.5–5.2)
SODIUM: 143 mmol/L (ref 134–144)
TOTAL PROTEIN: 7 g/dL (ref 6.0–8.5)

## 2018-02-21 LAB — VITAMIN D 25 HYDROXY (VIT D DEFICIENCY, FRACTURES): Vit D, 25-Hydroxy: 24.4 ng/mL — ABNORMAL LOW (ref 30.0–100.0)

## 2018-02-21 LAB — LIPID PANEL WITH LDL/HDL RATIO
Cholesterol, Total: 192 mg/dL (ref 100–199)
HDL: 58 mg/dL (ref >39)
LDL CALC: 125 mg/dL — AB (ref 0–99)
LDL/HDL RATIO: 2.2 ratio (ref 0.0–3.2)
TRIGLYCERIDES: 46 mg/dL (ref 0–149)
VLDL CHOLESTEROL CAL: 9 mg/dL (ref 5–40)

## 2018-02-21 LAB — INSULIN, RANDOM: INSULIN: 17.7 u[IU]/mL (ref 2.6–24.9)

## 2018-02-21 LAB — HEMOGLOBIN A1C
Est. average glucose Bld gHb Est-mCnc: 111 mg/dL
HEMOGLOBIN A1C: 5.5 % (ref 4.8–5.6)

## 2018-03-02 MED FILL — VIT D2 1.25 MG (50,000 UNIT: 1.25 MG | 28 days supply | Qty: 4 | Fill #0

## 2018-03-06 NOTE — Progress Notes (Signed)
Office: [phone]  /  Fax: [phone]   Date: March 07, 2018   Time Seen: 8:32am Duration: 28 minutes Provider: Glennie Isle, Psy.D. Type of Session: Individual Therapy   HPI: Sydney Dominguez referred by Dr. Rosemary Holms was seen by this provider for an initial appointment on October 05, 2017.Her modified PHQ-9 score at her visit on September 20, 2017 with Dr. Leafy Ro was 77. During the initial appointment with this provider, Sydney Dominguez shared she has Binge Eating Disorder and noted, "I've been doing it since I was 27 years old." Sydney Dominguez shared Binge Eating Disorder has never been "formally diagnosed" by a doctor as she "always hid it." She shared she uses "food as an outlet," craves carbohydrates, eats when she is stressed, and wakes up at night to eat. She also eats when experiencing other emotions, such as "happy" and "angry." Sydney Dominguez also shared she eats when dealing with her family as they often sabotage her. Currently, Sydney Dominguez attempts portion control. Sydney Dominguez reported she last bingedthe night before the initial appointment with this provider. She indicated she made spaghetti with veggie crumbles. She also ate two peanut butter and jelly sandwiches, fruit, and an additional sandwich. Sydney Dominguez reported she ate two other things, but could not recall what she ate. The aforementioned was consumed over the course of an hour. She indicated she binges every day. Sydney Dominguez described feeling "nasty" afterward and feels she has done something wrong. She noted she sometimes becomes tearful. Sydney Dominguez noted she does not fully chew and does not enjoy her food. She eats to satisfy a craving or urge. Per Sydney Dominguez, she binges two to three times a day and is triggered by her family. After "cutting them off," family stress has decreased. Currently, Sydney Dominguez also experiences racing thoughts, increased energy, and social withdrawal. Two days ago, she noted she was really down and it made her feel  "sluggish" and hopeless." In addition, Sydney Dominguez reported a desire for bariatric surgery, but noted she plans to improve her eating habits and reduce binge eating prior to the surgery. Moreover, she expressed a desire to be healthy, live longer, and be around to see her daughter graduate. She noted, "I'm taking control."  Session Content: Session focused on reviewing learned skills and termination. The session was initiated with the administration of the PHQ-9 and GAD-7, as well as a brief check-in. Sydney Dominguez shared she continues to practice mindfulness "every morning." She noted a decrease in worry related to her daughter. Additionally, she denied engagement in binge eating and emotional eating. Sydney Dominguez described engagement in meal prep and portion control. Moreover, she reported an improvement in her mood and an increase in physical activity. Furthermore, Sydney Dominguez shared a plan to have bariatric surgery in February of 2020. She noted she met with a surgeon and was reportedly informed she is a "good candidate." Due to the upcoming surgery, this provider recommended  Morning continue therapeutic services for support. She agreed. Sydney Dominguez was also informed that at this time this provider is unaware of any groups in the area that focus on emotional eating. This provider informed Sydney Dominguez that should she learn of any groups, she will inform Sydney Dominguez. Remainder of session focused on reviewing learned skills. When experiencing triggers for emotional eating, [name] discussed engaging in thought defusion and mindfulness to help her cope. She requested additional copies of thought defusion handouts. Session concluded with a mindfulness exercise. Following the exercise, [name] reported it was "so relaxing." A handout for the exercise was provided. [name] was receptive to today's session  as evidenced by her openness to sharing, responsiveness to feedback, and engagement in the mindfulness exercise. The appointment  concluded with Sydney Dominguez sharing, "I've learned so much."   Mental Status Examination: Sydney Dominguez arrived on time for the appointment; however, the appointment was initiated a couple minutes late due to the check-in process. She presented as appropriately dressed and groomed. Sydney Dominguez appeared her stated age and demonstrated adequate orientation to time, place, person, and purpose of the appointment. She also demonstrated appropriate eye contact. No psychomotor abnormalities or behavioral peculiarities noted. Her mood was euthymic with congruent affect. Her thought processes were logical, linear, and goal-directed. No hallucinations, delusions, bizarre thinking or behavior reported or observed. Judgment, insight, and impulse control appeared to be grossly intact. There was no evidence of paraphasias (i.e., errors in speech, gross mispronunciations, and word substitutions), repetition deficits, or disturbances in volume or prosody (i.e., rhythm and intonation). There was no evidence of attention or memory impairments. Sydney Dominguez denied current suicidal and homicidal ideation, intent or plan.  Structured Assessment Results: The Patient Health Questionnaire-9 (PHQ-9) is a self-report measure that assesses symptoms and severity of depression over the course of the last two weeks. Sydney Dominguez obtained a score of one suggesting minimal depression. Sydney Dominguez finds the endorsed symptoms to be not difficult at all. Depression screen PHQ 2/9 03/07/2018  Decreased Interest 0  Down, Depressed, Hopeless 0  PHQ - 2 Score 0  Altered sleeping 0  Tired, decreased energy 0  Change in appetite 0  Feeling bad or failure about yourself  0  Trouble concentrating 1  Moving slowly or fidgety/restless 0  Suicidal thoughts 0  PHQ-9 Score 1  Difficult doing work/chores -  Some recent data might be hidden   The Generalized Anxiety Disorder-7 (GAD-7) is a brief self-report measure that assesses symptoms of anxiety over the course of  the last two weeks. Sydney Dominguez obtained a score of zero.  GAD 7 : Generalized Anxiety Score 03/07/2018  Nervous, Anxious, on Edge 0  Control/stop worrying 0  Worry too much - different things 0  Trouble relaxing 0  Restless 0  Easily annoyed or irritable 0  Afraid - awful might happen 0  Total GAD 7 Score 0  Anxiety Difficulty Not difficult at all   Interventions: Hayli was administered the PHQ-9 and GAD-7 for symptom monitoring. Throughout today's session, empathic reflections and validation were provided. This provider reviewed skills with Jaslynne and she was led through a mindfulness exercise.  DSM-5 Diagnosis: 307.51 (F50.8) Binge-Eating Disorder, Extreme and 296.89 (F31.81) Bipolar II Disorder (by history)  Treatment Goal & Progress: Neelah was seen for an initial appointment with this provider on October 05, 2017 during which the following treatment goals were established: reducing emotional and binge eating and increasing coping skills. Shakara demonstrated progress in her goal of reducing emotional and binge eating as evidenced by increased awareness of hunger patterns and triggers for emotional eating. She also continued to deny engaging in binge eating and emotional eating. Leiya demonstrated progress in her goal of increasing coping skills as evidenced by her self-report of engaging in learned skills. Moreover, a decrease in PHQ-9 and GAD-7 scores since the onset of treatment with this provider was noted.   Plan: Gaynor will be referred for longer-term therapeutic services that will focus on supportive therapy and increasing coping skills to assist with her plan of bariatric surgery in February of 2020. She verbally provided permission to note the aforementioned in the referral.

## 2018-03-07 ENCOUNTER — Ambulatory Visit (INDEPENDENT_AMBULATORY_CARE_PROVIDER_SITE_OTHER): Payer: No Typology Code available for payment source | Admitting: Psychology

## 2018-03-07 DIAGNOSIS — R632 Polyphagia: Secondary | ICD-10-CM | POA: Diagnosis not present

## 2018-03-10 ENCOUNTER — Other Ambulatory Visit (HOSPITAL_COMMUNITY): Payer: Self-pay | Admitting: General Surgery

## 2018-03-15 ENCOUNTER — Encounter (INDEPENDENT_AMBULATORY_CARE_PROVIDER_SITE_OTHER): Payer: Self-pay | Admitting: Physician Assistant

## 2018-03-15 ENCOUNTER — Ambulatory Visit (INDEPENDENT_AMBULATORY_CARE_PROVIDER_SITE_OTHER): Payer: No Typology Code available for payment source | Admitting: Physician Assistant

## 2018-03-15 VITALS — BP 112/73 | HR 81 | Temp 97.9°F | Ht 63.0 in | Wt 247.0 lb

## 2018-03-15 DIAGNOSIS — E8881 Metabolic syndrome: Secondary | ICD-10-CM | POA: Diagnosis not present

## 2018-03-15 DIAGNOSIS — Z9189 Other specified personal risk factors, not elsewhere classified: Secondary | ICD-10-CM

## 2018-03-15 DIAGNOSIS — Z6841 Body Mass Index (BMI) 40.0 and over, adult: Secondary | ICD-10-CM

## 2018-03-15 DIAGNOSIS — E559 Vitamin D deficiency, unspecified: Secondary | ICD-10-CM

## 2018-03-15 MED ORDER — VITAMIN D (ERGOCALCIFEROL) 1.25 MG (50000 UNIT) PO CAPS: 50000.0000 [IU] | ORAL_CAPSULE | ORAL | 0 refills | Status: DC

## 2018-03-20 ENCOUNTER — Ambulatory Visit (HOSPITAL_COMMUNITY)
Admission: RE | Admit: 2018-03-20 | Discharge: 2018-03-20 | Disposition: A | Discharge status: Home / Self Care | Payer: No Typology Code available for payment source | Source: Ambulatory Visit | Attending: General Surgery | Admitting: General Surgery

## 2018-03-22 MED FILL — VIT D2 1.25 MG (50,000 UNIT: 1.25 MG | 28 days supply | Qty: 8 | Fill #0

## 2018-03-22 NOTE — Progress Notes (Signed)
Office: [phone]  /  Fax: [phone]   HPI:   Chief Complaint: OBESITY Sydney Dominguez is here to discuss her progress with her obesity treatment plan. She is on the Category 2 plan and is following her eating plan approximately 25 % of the time. She states she is walking 30 minutes 1 time per week. [name] reports that she has been attending holiday parties and has had a difficult time staying on the plan. She is ready to get back on track.  Her weight is 247 lb (112 kg) today and has had a weight gain of 4 pounds over a period of 3 weeks since her last visit. She has lost 7 lbs since starting treatment with [place].  Vitamin D deficiency [name] has a diagnosis of vitamin D deficiency. She is currently taking vit D and her last level was not at goal. She denies nausea, vomiting, or muscle weakness.  At risk for osteopenia and osteoporosis [name] is at higher risk of osteopenia and osteoporosis due to vitamin D deficiency.   Insulin Resistance [name] has a diagnosis of insulin resistance based on her elevated fasting insulin level >5. Although [name]'s blood glucose readings are still under good control, insulin resistance puts her at greater risk of metabolic syndrome and diabetes. She is taking metformin, but is unable to tolerate metformin due to nausea. She states that she was eating on the plan at that time. She continues to work on diet and exercise to decrease risk of diabetes.  ALLERGIES: No Known Allergies  MEDICATIONS: Current Outpatient Medications on File Prior to Visit  Medication Sig Dispense Refill  . lamoTRIgine (LAMICTAL) 200 MG tablet Take 1 tablet (200 mg total) by mouth daily. 90 tablet 0  . metFORMIN (GLUCOPHAGE) 500 MG tablet Take 1 tablet (500 mg total) by mouth daily with breakfast. 30 tablet 0  . Spirulina POWD 5 mg by Does not apply route 3 (three) times daily.     No current facility-administered medications on file prior to visit.     PAST MEDICAL  HISTORY: Past Medical History:  Diagnosis Date  . Anemia   . Anxiety   . Asthma   . B12 deficiency   . Back pain   . Bipolar disorder (HCC)   . Chest pain   . Constipation   . Depression   . Dyspnea   . GERD (gastroesophageal reflux disease)   . Palpitations   . Prediabetes   . Schizophrenia (HCC)   . Vertigo   . Vitamin D deficiency     PAST SURGICAL HISTORY: Past Surgical History:  Procedure Laterality Date  . HAND SURGERY     l hand    SOCIAL HISTORY: Social History   Tobacco Use  . Smoking status: Never Smoker  . Smokeless tobacco: Never Used  Substance Use Topics  . Alcohol use: No    Alcohol/week: 0.0 standard drinks  . Drug use: No    FAMILY HISTORY: Family History  Problem Relation Age of Onset  . Diabetes Mother   . Hypertension Mother   . Thyroid disease Mother   . Depression Mother   . Anxiety disorder Mother   . Obesity Mother   . Hypertension Father   . Hyperlipidemia Father   . Depression Father   . Anxiety disorder Father   . Bipolar disorder Father   . Schizophrenia Father   . Alcoholism Father   . Eating disorder Father   . Hypertension Maternal Grandmother   . Cancer Maternal Grandfather  prostate  . Anesthesia problems Neg Hx     ROS: Review of Systems  Constitutional: Negative for weight loss.  Gastrointestinal: Positive for nausea. Negative for vomiting.  Musculoskeletal:       Negative for muscle weakness.    PHYSICAL EXAM: Blood pressure 112/73, pulse 81, temperature 97.9 F (36.6 C), temperature source Oral, height 5\' 3"  (1.6 m), weight 247 lb (112 kg), SpO2 100 %. Body mass index is 43.75 kg/m. Physical Exam  Constitutional: She is oriented to person, place, and time. She appears well-developed and well-nourished.  Cardiovascular: Normal rate.  Pulmonary/Chest: Effort normal.  Musculoskeletal: Normal range of motion.  Neurological: She is oriented to person, place, and time.  Skin: Skin is warm and dry.    Psychiatric: She has a normal mood and affect. Her behavior is normal.  Vitals reviewed.   RECENT LABS AND TESTS: BMET    Component Value Date/Time   NA 143 02/20/2018 0845   K 4.3 02/20/2018 0845   CL 104 02/20/2018 0845   CO2 23 02/20/2018 0845   GLUCOSE 82 02/20/2018 0845   GLUCOSE 94 09/20/2012 1400   BUN 8 02/20/2018 0845   CREATININE 0.74 02/20/2018 0845   CALCIUM 9.6 02/20/2018 0845   GFRNONAA 111 02/20/2018 0845   GFRAA 127 02/20/2018 0845   Lab Results  Component Value Date   HGBA1C 5.5 02/20/2018   HGBA1C 5.5 09/20/2017   Lab Results  Component Value Date   INSULIN 17.7 02/20/2018   INSULIN 16.9 09/20/2017   CBC    Component Value Date/Time   WBC 5.2 09/20/2017 1033   WBC 7.8 09/20/2012 1400   RBC 4.58 09/20/2017 1033   RBC 4.79 09/20/2012 1400   HGB 11.8 09/20/2017 1033   HCT 36.7 09/20/2017 1033   PLT 371 09/20/2012 1400   MCV 80 09/20/2017 1033   MCH 25.8 (L) 09/20/2017 1033   MCH 28.4 09/20/2012 1400   MCHC 32.2 09/20/2017 1033   MCHC 34.2 09/20/2012 1400   RDW 16.8 (H) 09/20/2017 1033   LYMPHSABS 1.2 09/20/2017 1033   MONOABS 0.6 09/20/2012 1400   EOSABS 0.1 09/20/2017 1033   BASOSABS 0.0 09/20/2017 1033   Iron/TIBC/Ferritin/ %Sat No results found for: IRON, TIBC, FERRITIN, IRONPCTSAT Lipid Panel     Component Value Date/Time   CHOL 192 02/20/2018 0845   TRIG 46 02/20/2018 0845   HDL 58 02/20/2018 0845   LDLCALC 125 (H) 02/20/2018 0845   Hepatic Function Panel     Component Value Date/Time   PROT 7.0 02/20/2018 0845   ALBUMIN 4.6 02/20/2018 0845   AST 13 02/20/2018 0845   ALT 14 02/20/2018 0845   ALKPHOS 62 02/20/2018 0845   BILITOT <0.2 02/20/2018 0845      Component Value Date/Time   TSH 0.794 09/20/2017 1033   Results for Epping, Lira "Sydney Dominguez" (MRN [id number]) as of 03/22/2018 10:09  Ref. Range 02/20/2018 08:45  Vitamin D, 25-Hydroxy Latest Ref Range: 30.0 - 100.0 ng/mL 24.4 (L)   ASSESSMENT AND  PLAN: Vitamin D deficiency - Plan: Vitamin D, Ergocalciferol, (DRISDOL) 1.25 MG (50000 UT) CAPS capsule  Insulin resistance  At risk for osteoporosis  Class 3 severe obesity with serious comorbidity and body mass index (BMI) of 40.0 to 44.9 in adult, unspecified obesity type (HCC)  PLAN:  Vitamin D Deficiency Dusty was informed that low vitamin D levels contributes to fatigue and are associated with obesity, breast, and colon cancer. She agrees to continue to change prescription Vit D @50 ,000  IU to twice a week #10 with no refills and will follow up for routine testing of vitamin D, at least 2-3 times per year. She was informed of the risk of over-replacement of vitamin D and agrees to not increase her dose unless she discusses this with [place] first. Dacie agrees to follow up in 3 weeks.  At risk for osteopenia and osteoporosis Tiphanie was given extended (15 minutes) osteoporosis prevention counseling today. Venisha is at risk for osteopenia and osteoporosis due to her vitamin D deficiency. She was encouraged to take her vitamin D and follow her higher calcium diet and increase strengthening exercise to help strengthen her bones and decrease her risk of osteopenia and osteoporosis.  Insulin Resistance Evelynne will continue to work on weight loss, exercise, and decreasing simple carbohydrates in her diet to help decrease the risk of diabetes. We discussed metformin including benefits and risks. She was informed that eating too many simple carbohydrates or too many calories at one sitting increases the likelihood of GI side effects. Jacob was advised to take 1/2 a metformin tablet for now and prescription was not written today. Rosaura agreed to follow up with [place] as directed to monitor her progress.  Obesity Rosary is currently in the action stage of change. As such, her goal is to continue with weight loss efforts. She has agreed to follow the Category 2 plan. Bryah has been  instructed to work up to a goal of 150 minutes of combined cardio and strengthening exercise per week for weight loss and overall health benefits. We discussed the following Behavioral Modification Strategies today: work on meal planning and easy cooking plans and holiday eating strategies.   Meilin has agreed to follow up with our clinic in 3 weeks. She was informed of the importance of frequent follow up visits to maximize her success with intensive lifestyle modifications for her multiple health conditions.   OBESITY BEHAVIORAL INTERVENTION VISIT  Today's visit was # 6   Starting weight: 254 lbs Starting date: 09/20/17 Today's weight : Weight: 247 lb (112 kg)  Today's date: 03/15/2018 Total lbs lost to date: 7  ASK: We discussed the diagnosis of obesity with Rebecca Eaton today and Jesicca agreed to give [place] permission to discuss obesity behavioral modification therapy today.  ASSESS: Shaili has the diagnosis of obesity and her BMI today is 43.76. Rosamary is in the action stage of change.   ADVISE: Javana was educated on the multiple health risks of obesity as well as the benefit of weight loss to improve her health. She was advised of the need for long term treatment and the importance of lifestyle modifications to improve her current health and to decrease her risk of future health problems.  AGREE: Multiple dietary modification options and treatment options were discussed and Maida agreed to follow the recommendations documented in the above note.  ARRANGE: Bryanah was educated on the importance of frequent visits to treat obesity as outlined per CMS and USPSTF guidelines and agreed to schedule her next follow up appointment today.  Launa Flight, am acting as transcriptionist for Alois Cliche, PA-C I, Alois Cliche, PA-C have reviewed above note and agree with its content

## 2018-03-29 ENCOUNTER — Encounter: Payer: Self-pay | Admitting: Skilled Nursing Facility1

## 2018-03-29 ENCOUNTER — Encounter: Payer: No Typology Code available for payment source | Attending: General Surgery | Admitting: Skilled Nursing Facility1

## 2018-03-29 DIAGNOSIS — E669 Obesity, unspecified: Secondary | ICD-10-CM

## 2018-03-29 NOTE — Progress Notes (Signed)
Pre-Op Assessment Visit:  Pre-Operative RYGB Surgery  Medical Nutrition Therapy:  Appt start time: 11:00  End time:  12:00  Patient was seen on 03/29/18 for Pre-Operative Nutrition Assessment. [facility] on 03/29/18.   Pt expectation of surgery: weight maintenance, successful/safe surg, lose at least 50-60lb, support (groups). Pt states has fam support, fiance, friends.  Pt expectation of Dietitian: support, realistic meal plans/lifestyle  Pt educated about ETOH, PA and BELT   Start weight at [facility]: 251.0 BMI: 44.04   24 hr Dietary Recall: states might have lactose sensisitivity, no beef or pork First Meal:  6a greek plain yogurt with nuts and/or fruit (peaches/strawberries), maybe almond mlk and 2 boiled eggs Snack: none Second Meal: 2p tuna or [place] sandwich, bag of baked chips, carrot sticks or celery, strawberries or grapes, cottage cheese Snack: none Third Meal: 6 or 7p chicken soup, carots, celery mushrooms, salads Snack: cran juice Beverages: OJ or hot tea, almond milk, water  Encouraged to engage in 150 minutes of moderate physical activity including cardiovascular and weight baring weekly  Handouts given during visit include:  . Pre-Op Goals . Bariatric Surgery Protein Shakes During the appointment today the following Pre-Op Goals were reviewed with the patient: . Maintain or lose weight as instructed by your surgeon . Make healthy food choices . Begin to limit portion sizes . Limited concentrated sugars and fried foods . Keep fat/sugar in the single digits per serving on             food labels . Practice CHEWING your food  (aim for 30 chews per bite or until applesauce consistency) . Practice not drinking 15 minutes before, during, and 30 minutes after each meal/snack . Avoid all carbonated beverages  . Avoid/limit caffeinated beverages  . Avoid all sugar-sweetened  beverages . Consume 3 meals per day; eat every 3-5 hours . Make a list of non-food related activities . Aim for 64-100 ounces of FLUID daily  . Aim for at least 60-80 grams of PROTEIN daily . Look for a liquid protein source that contain ?15 g protein and ?5 g carbohydrate  (ex: shakes, drinks, shots)  -Follow diet recommendations listed below   Energy and Macronutrient Recomendations: Calories: 2000 Carbohydrate: 225g Protein: 150g Fat: 56g  Demonstrated degree of understanding via:  Teach Back  Teaching Method Utilized:  Visual Auditory Hands on  Barriers to learning/adherence to lifestyle change: none identified  Patient to call the Nutrition and Diabetes Education Services to enroll in Pre-Op and Post-Op Nutrition Education when surgery date is scheduled.

## 2018-04-05 ENCOUNTER — Encounter (INDEPENDENT_AMBULATORY_CARE_PROVIDER_SITE_OTHER): Payer: Self-pay | Admitting: Physician Assistant

## 2018-04-05 ENCOUNTER — Ambulatory Visit (INDEPENDENT_AMBULATORY_CARE_PROVIDER_SITE_OTHER): Payer: No Typology Code available for payment source | Admitting: Physician Assistant

## 2018-04-05 VITALS — BP 119/78 | HR 68 | Temp 97.8°F | Ht 63.0 in | Wt 248.0 lb

## 2018-04-05 DIAGNOSIS — Z9189 Other specified personal risk factors, not elsewhere classified: Secondary | ICD-10-CM | POA: Diagnosis not present

## 2018-04-05 DIAGNOSIS — E7849 Other hyperlipidemia: Secondary | ICD-10-CM | POA: Diagnosis not present

## 2018-04-05 DIAGNOSIS — Z6841 Body Mass Index (BMI) 40.0 and over, adult: Secondary | ICD-10-CM

## 2018-04-05 DIAGNOSIS — E559 Vitamin D deficiency, unspecified: Secondary | ICD-10-CM | POA: Diagnosis not present

## 2018-04-05 MED ORDER — VITAMIN D (ERGOCALCIFEROL) 1.25 MG (50000 UNIT) PO CAPS: 50000.0000 [IU] | ORAL_CAPSULE | ORAL | 0 refills | Status: DC

## 2018-04-06 NOTE — Progress Notes (Signed)
Office: [phone]  /  Fax: [phone]   HPI:   Chief Complaint: OBESITY Sydney Dominguez is here to discuss her progress with her obesity treatment plan. She is on the Category 2 plan and is following her eating plan approximately 100 % of the time. She states she is walking and cycling 30 minutes 4 times per week. [name] reports that she is not getting enough protein or calories in. She states that she is sometimes eating oatmeal for breakfast. She is ready to get back on track. Her weight is 248 lb (112.5 kg) today and has had a weight gain of 1 pound over a period of 3 weeks since her last visit. She has lost 6 lbs since starting treatment with us.  Vitamin D deficiency Sydney Dominguez has a diagnosis of vitamin D deficiency. She is currently taking vit D and denies nausea, vomiting or muscle weakness.  Hyperlipidemia Sydney Dominguez has hyperlipidemia and she is not on medications. She has been trying to improve her cholesterol levels with intensive lifestyle modification including a low saturated fat diet, exercise and weight loss. She denies any chest pain.  At risk for cardiovascular disease Sydney Dominguez is at a higher than average risk for cardiovascular disease due to obesity and hyperlipidemia. She currently denies any chest pain.  ASSESSMENT AND PLAN:  Vitamin D deficiency - Plan: Vitamin D, Ergocalciferol, (DRISDOL) 1.25 MG (50000 UT) CAPS capsule  Other hyperlipidemia  At risk for heart disease  Class 3 severe obesity with serious comorbidity and body mass index (BMI) of 40.0 to 44.9 in adult, unspecified obesity type (HCC)  PLAN:  Vitamin D Deficiency Sydney Dominguez was informed that low vitamin D levels contributes to fatigue and are associated with obesity, breast, and colon cancer. She agrees to continue to take prescription Vit D @50 ,000 IU every week #4 with no refills and will follow up for routine testing of vitamin D, at least 2-3 times per year. She was informed of the risk of  over-replacement of vitamin D and agrees to not increase her dose unless she discusses this with us first. [name] agrees to follow up as directed.  Hyperlipidemia Sydney Dominguez was informed of the American Heart Association Guidelines emphasizing intensive lifestyle modifications as the first line treatment for hyperlipidemia. We discussed many lifestyle modifications today in depth, and Sydney Dominguez will continue to work on decreasing saturated fats such as fatty red meat, butter and many fried foods. She will also increase vegetables and lean protein in her diet and continue to work on exercise and weight loss efforts.  Cardiovascular risk counseling Sydney Dominguez was given extended (15 minutes) coronary artery disease prevention counseling today. She is 27 y.o. female and has risk factors for heart disease including obesity and hyperlipidemia. We discussed intensive lifestyle modifications today with an emphasis on specific weight loss instructions and strategies. Pt was also informed of the importance of increasing exercise and decreasing saturated fats to help prevent heart disease.  Obesity Sydney Dominguez is currently in the action stage of change. As such, her goal is to continue with weight loss efforts She has agreed to follow the Category 2 plan Sydney Dominguez has been instructed to work up to a goal of 150 minutes of combined cardio and strengthening exercise per week for weight loss and overall health benefits. We discussed the following Behavioral Modification Strategies today: work on meal planning and easy cooking plans and holiday eating strategies   Sydney Dominguez has agreed to follow up with our clinic in 3 weeks. She was informed of the importance  of frequent follow up visits to maximize her success with intensive lifestyle modifications for her multiple health conditions.  ALLERGIES: No Known Allergies  MEDICATIONS: Current Outpatient Medications on File Prior to Visit  Medication Sig Dispense Refill  .  lamoTRIgine (LAMICTAL) 200 MG tablet Take 1 tablet (200 mg total) by mouth daily. 90 tablet 0  . metFORMIN (GLUCOPHAGE) 500 MG tablet Take 1 tablet (500 mg total) by mouth daily with breakfast. 30 tablet 0  . Spirulina POWD 5 mg by Does not apply route 3 (three) times daily.     No current facility-administered medications on file prior to visit.     PAST MEDICAL HISTORY: Past Medical History:  Diagnosis Date  . Anemia   . Anxiety   . Asthma   . B12 deficiency   . Back pain   . Bipolar disorder (HCC)   . Chest pain   . Constipation   . Depression   . Dyspnea   . GERD (gastroesophageal reflux disease)   . Palpitations   . Prediabetes   . Schizophrenia (HCC)   . Vertigo   . Vitamin D deficiency     PAST SURGICAL HISTORY: Past Surgical History:  Procedure Laterality Date  . HAND SURGERY     l hand    SOCIAL HISTORY: Social History   Tobacco Use  . Smoking status: Never Smoker  . Smokeless tobacco: Never Used  Substance Use Topics  . Alcohol use: No    Alcohol/week: 0.0 standard drinks  . Drug use: No    FAMILY HISTORY: Family History  Problem Relation Age of Onset  . Diabetes Mother   . Hypertension Mother   . Thyroid disease Mother   . Depression Mother   . Anxiety disorder Mother   . Obesity Mother   . Hypertension Father   . Hyperlipidemia Father   . Depression Father   . Anxiety disorder Father   . Bipolar disorder Father   . Schizophrenia Father   . Alcoholism Father   . Eating disorder Father   . Hypertension Maternal Grandmother   . Cancer Maternal Grandfather        prostate  . Anesthesia problems Neg Hx     ROS: Review of Systems  Constitutional: Negative for weight loss.  Cardiovascular: Negative for chest pain.  Gastrointestinal: Negative for nausea and vomiting.  Musculoskeletal:       Negative for muscle weakness    PHYSICAL EXAM: Blood pressure 119/78, pulse 68, temperature 97.8 F (36.6 C), temperature source Oral, height 5'  3" (1.6 m), weight 248 lb (112.5 kg), last menstrual period 03/20/2018, SpO2 100 %. Body mass index is 43.93 kg/m. Physical Exam Vitals signs reviewed.  Constitutional:      Appearance: Normal appearance. She is well-developed. She is obese.  Cardiovascular:     Rate and Rhythm: Normal rate.  Pulmonary:     Effort: Pulmonary effort is normal.  Musculoskeletal: Normal range of motion.  Skin:    General: Skin is warm and dry.  Neurological:     Mental Status: She is alert and oriented to person, place, and time.  Psychiatric:        Mood and Affect: Mood normal.        Behavior: Behavior normal.     RECENT LABS AND TESTS: BMET    Component Value Date/Time   NA 143 02/20/2018 0845   K 4.3 02/20/2018 0845   CL 104 02/20/2018 0845   CO2 23 02/20/2018 0845   GLUCOSE  82 02/20/2018 0845   GLUCOSE 94 09/20/2012 1400   BUN 8 02/20/2018 0845   CREATININE 0.74 02/20/2018 0845   CALCIUM 9.6 02/20/2018 0845   GFRNONAA 111 02/20/2018 0845   GFRAA 127 02/20/2018 0845   Lab Results  Component Value Date   HGBA1C 5.5 02/20/2018   HGBA1C 5.5 09/20/2017   Lab Results  Component Value Date   INSULIN 17.7 02/20/2018   INSULIN 16.9 09/20/2017   CBC    Component Value Date/Time   WBC 5.2 09/20/2017 1033   WBC 7.8 09/20/2012 1400   RBC 4.58 09/20/2017 1033   RBC 4.79 09/20/2012 1400   HGB 11.8 09/20/2017 1033   HCT 36.7 09/20/2017 1033   PLT 371 09/20/2012 1400   MCV 80 09/20/2017 1033   MCH 25.8 (L) 09/20/2017 1033   MCH 28.4 09/20/2012 1400   MCHC 32.2 09/20/2017 1033   MCHC 34.2 09/20/2012 1400   RDW 16.8 (H) 09/20/2017 1033   LYMPHSABS 1.2 09/20/2017 1033   MONOABS 0.6 09/20/2012 1400   EOSABS 0.1 09/20/2017 1033   BASOSABS 0.0 09/20/2017 1033   Iron/TIBC/Ferritin/ %Sat No results found for: IRON, TIBC, FERRITIN, IRONPCTSAT Lipid Panel     Component Value Date/Time   CHOL 192 02/20/2018 0845   TRIG 46 02/20/2018 0845   HDL 58 02/20/2018 0845   LDLCALC 125 (H)  02/20/2018 0845   Hepatic Function Panel     Component Value Date/Time   PROT 7.0 02/20/2018 0845   ALBUMIN 4.6 02/20/2018 0845   AST 13 02/20/2018 0845   ALT 14 02/20/2018 0845   ALKPHOS 62 02/20/2018 0845   BILITOT <0.2 02/20/2018 0845      Component Value Date/Time   TSH 0.794 09/20/2017 1033    Ref. Range 02/20/2018 08:45  Vitamin D, 25-Hydroxy Latest Ref Range: 30.0 - 100.0 ng/mL 24.4 (L)     OBESITY BEHAVIORAL INTERVENTION VISIT  Today's visit was # 6   Starting weight: 254 lbs Starting date: 09/20/2017 Today's weight : 248 lbs Today's date: 04/05/2018 Total lbs lost to date: 6   ASK: We discussed the diagnosis of obesity with Rebecca EatonHasheema Hawthorne today and Reygan agreed to give us permission to discuss obesity behavioral modification therapy today.  ASSESS: Clarise has the diagnosis of obesity and her BMI today is 43.94 Jacquelin is in the action stage of change   ADVISE: Kymiah was educated on the multiple health risks of obesity as well as the benefit of weight loss to improve her health. She was advised of the need for long term treatment and the importance of lifestyle modifications to improve her current health and to decrease her risk of future health problems.  AGREE: Multiple dietary modification options and treatment options were discussed and  Jesly agreed to follow the recommendations documented in the above note.  ARRANGE: Bailynn was educated on the importance of frequent visits to treat obesity as outlined per CMS and USPSTF guidelines and agreed to schedule her next follow up appointment today.  Cristi LoronI, Joanne Murray, am acting as transcriptionist for Alois Clicheracey Lechelle Wrigley, PA-C I, Alois Clicheracey Orla Jolliff, PA-C have reviewed above note and agree with its content

## 2018-04-26 ENCOUNTER — Ambulatory Visit: Payer: No Typology Code available for payment source | Admitting: Psychology

## 2018-04-27 ENCOUNTER — Encounter (INDEPENDENT_AMBULATORY_CARE_PROVIDER_SITE_OTHER): Payer: Self-pay | Admitting: Physician Assistant

## 2018-04-27 ENCOUNTER — Ambulatory Visit (INDEPENDENT_AMBULATORY_CARE_PROVIDER_SITE_OTHER): Payer: No Typology Code available for payment source | Admitting: Physician Assistant

## 2018-04-27 VITALS — BP 111/63 | HR 74 | Temp 98.0°F | Ht 63.0 in | Wt 250.0 lb

## 2018-04-27 DIAGNOSIS — Z6841 Body Mass Index (BMI) 40.0 and over, adult: Secondary | ICD-10-CM

## 2018-04-27 DIAGNOSIS — E559 Vitamin D deficiency, unspecified: Secondary | ICD-10-CM | POA: Diagnosis not present

## 2018-04-27 NOTE — Progress Notes (Signed)
Office: [phone]  /  Fax: [phone]   HPI:   Chief Complaint: OBESITY Matasha is here to discuss her progress with her obesity treatment plan. She is on the Category 2 plan and is following her eating plan approximately 100 % of the time. She states she is walking 30 minutes 3 times per week. Nyema reports that she got off track with holiday eating. She would like to change plans today. She is ready to get back on track. Chelcy also reports that she is interested in bariatric surgery. Her weight is 250 lb (113.4 kg) today and has had a weight gain of 2 pounds over a period of 3 weeks since her last visit. She has lost 4 lbs since starting treatment with us.  Vitamin D deficiency Sydney has a diagnosis of vitamin D deficiency. She is currently taking vit D and denies nausea, vomiting or muscle weakness.  ASSESSMENT AND PLAN:  Vitamin D deficiency  Class 3 severe obesity with serious comorbidity and body mass index (BMI) of 40.0 to 44.9 in adult, unspecified obesity type (HCC)  PLAN:  Vitamin D Deficiency Aliegha was informed that low vitamin D levels contributes to fatigue and are associated with obesity, breast, and colon cancer. She agrees to continue to take prescription Vit D @50 ,000 IU every week and will follow up for routine testing of vitamin D, at least 2-3 times per year. She was informed of the risk of over-replacement of vitamin D and agrees to not increase her dose unless she discusses this with us first.  I spent > than 50% of the 15 minute visit on counseling as documented in the note.  Obesity Nashia is currently in the action stage of change. As such, her goal is to continue with weight loss efforts She has agreed to follow our protein rich vegetarian plan Evona has been instructed to work up to a goal of 150 minutes of combined cardio and strengthening exercise per week for weight loss and overall health benefits. We discussed the following  Behavioral Modification Strategies today: increasing lean protein intake and work on meal planning and easy cooking plans  Justis has agreed to follow up with our clinic in 2 to 3 weeks. She was informed of the importance of frequent follow up visits to maximize her success with intensive lifestyle modifications for her multiple health conditions.  ALLERGIES: No Known Allergies  MEDICATIONS: Current Outpatient Medications on File Prior to Visit  Medication Sig Dispense Refill  . lamoTRIgine (LAMICTAL) 200 MG tablet Take 1 tablet (200 mg total) by mouth daily. 90 tablet 0  . metFORMIN (GLUCOPHAGE) 500 MG tablet Take 1 tablet (500 mg total) by mouth daily with breakfast. 30 tablet 0  . Spirulina POWD 5 mg by Does not apply route 3 (three) times daily.    . Vitamin D, Ergocalciferol, (DRISDOL) 1.25 MG (50000 UT) CAPS capsule Take 1 capsule (50,000 Units total) by mouth every 7 (seven) days. 4 capsule 0   No current facility-administered medications on file prior to visit.     PAST MEDICAL HISTORY: Past Medical History:  Diagnosis Date  . Anemia   . Anxiety   . Asthma   . B12 deficiency   . Back pain   . Bipolar disorder (HCC)   . Chest pain   . Constipation   . Depression   . Dyspnea   . GERD (gastroesophageal reflux disease)   . Palpitations   . Prediabetes   . Schizophrenia (HCC)   .  Vertigo   . Vitamin D deficiency     PAST SURGICAL HISTORY: Past Surgical History:  Procedure Laterality Date  . HAND SURGERY     l hand    SOCIAL HISTORY: Social History   Tobacco Use  . Smoking status: Never Smoker  . Smokeless tobacco: Never Used  Substance Use Topics  . Alcohol use: No    Alcohol/week: 0.0 standard drinks  . Drug use: No    FAMILY HISTORY: Family History  Problem Relation Age of Onset  . Diabetes Mother   . Hypertension Mother   . Thyroid disease Mother   . Depression Mother   . Anxiety disorder Mother   . Obesity Mother   . Hypertension Father   .  Hyperlipidemia Father   . Depression Father   . Anxiety disorder Father   . Bipolar disorder Father   . Schizophrenia Father   . Alcoholism Father   . Eating disorder Father   . Hypertension Maternal Grandmother   . Cancer Maternal Grandfather        prostate  . Anesthesia problems Neg Hx     ROS: Review of Systems  Constitutional: Negative for weight loss.  Gastrointestinal: Negative for nausea and vomiting.  Musculoskeletal:       Negative for muscle weakness    PHYSICAL EXAM: Blood pressure 111/63, pulse 74, temperature 98 F (36.7 C), temperature source Oral, height 5\' 3"  (1.6 m), weight 250 lb (113.4 kg), SpO2 99 %. Body mass index is 44.29 kg/m. Physical Exam Vitals signs reviewed.  Constitutional:      Appearance: Normal appearance. She is well-developed. She is obese.  Cardiovascular:     Rate and Rhythm: Normal rate.  Pulmonary:     Effort: Pulmonary effort is normal.  Musculoskeletal: Normal range of motion.  Skin:    General: Skin is warm and dry.  Neurological:     Mental Status: She is alert and oriented to person, place, and time.  Psychiatric:        Mood and Affect: Mood normal.        Behavior: Behavior normal.     RECENT LABS AND TESTS: BMET    Component Value Date/Time   NA 143 02/20/2018 0845   K 4.3 02/20/2018 0845   CL 104 02/20/2018 0845   CO2 23 02/20/2018 0845   GLUCOSE 82 02/20/2018 0845   GLUCOSE 94 09/20/2012 1400   BUN 8 02/20/2018 0845   CREATININE 0.74 02/20/2018 0845   CALCIUM 9.6 02/20/2018 0845   GFRNONAA 111 02/20/2018 0845   GFRAA 127 02/20/2018 0845   Lab Results  Component Value Date   HGBA1C 5.5 02/20/2018   HGBA1C 5.5 09/20/2017   Lab Results  Component Value Date   INSULIN 17.7 02/20/2018   INSULIN 16.9 09/20/2017   CBC    Component Value Date/Time   WBC 5.2 09/20/2017 1033   WBC 7.8 09/20/2012 1400   RBC 4.58 09/20/2017 1033   RBC 4.79 09/20/2012 1400   HGB 11.8 09/20/2017 1033   HCT 36.7  09/20/2017 1033   PLT 371 09/20/2012 1400   MCV 80 09/20/2017 1033   MCH 25.8 (L) 09/20/2017 1033   MCH 28.4 09/20/2012 1400   MCHC 32.2 09/20/2017 1033   MCHC 34.2 09/20/2012 1400   RDW 16.8 (H) 09/20/2017 1033   LYMPHSABS 1.2 09/20/2017 1033   MONOABS 0.6 09/20/2012 1400   EOSABS 0.1 09/20/2017 1033   BASOSABS 0.0 09/20/2017 1033   Iron/TIBC/Ferritin/ %Sat No results found for:  IRON, TIBC, FERRITIN, IRONPCTSAT Lipid Panel     Component Value Date/Time   CHOL 192 02/20/2018 0845   TRIG 46 02/20/2018 0845   HDL 58 02/20/2018 0845   LDLCALC 125 (H) 02/20/2018 0845   Hepatic Function Panel     Component Value Date/Time   PROT 7.0 02/20/2018 0845   ALBUMIN 4.6 02/20/2018 0845   AST 13 02/20/2018 0845   ALT 14 02/20/2018 0845   ALKPHOS 62 02/20/2018 0845   BILITOT <0.2 02/20/2018 0845      Component Value Date/Time   TSH 0.794 09/20/2017 1033   Results for Mckee, Faduma "Shareeka Lazo" (MRN [id number]) as of 04/27/2018 15:06  Ref. Range 02/20/2018 08:45  Vitamin D, 25-Hydroxy Latest Ref Range: 30.0 - 100.0 ng/mL 24.4 (L)     OBESITY BEHAVIORAL INTERVENTION VISIT  Today's visit was # 8   Starting weight: 254 lbs Starting date: 09/20/2017 Today's weight : 250 lbs Today's date: 04/27/2018 Total lbs lost to date: 4   ASK: We discussed the diagnosis of obesity with Rebecca Eaton today and Jerline agreed to give [place] permission to discuss obesity behavioral modification therapy today.  ASSESS: Ninah has the diagnosis of obesity and her BMI today is 44.3 Bianca is in the action stage of change   ADVISE: Bobetta was educated on the multiple health risks of obesity as well as the benefit of weight loss to improve her health. She was advised of the need for long term treatment and the importance of lifestyle modifications to improve her current health and to decrease her risk of future health problems.  AGREE: Multiple dietary modification options and treatment  options were discussed and  Kashae agreed to follow the recommendations documented in the above note.  ARRANGE: Khayla was educated on the importance of frequent visits to treat obesity as outlined per CMS and USPSTF guidelines and agreed to schedule her next follow up appointment today.  Cristi Loron, am acting as transcriptionist for Alois Cliche, PA-C I, Alois Cliche, PA-C have reviewed above note and agree with its content

## 2018-05-01 ENCOUNTER — Encounter: Payer: Self-pay | Admitting: Skilled Nursing Facility1

## 2018-05-01 ENCOUNTER — Encounter: Payer: No Typology Code available for payment source | Attending: General Surgery | Admitting: Skilled Nursing Facility1

## 2018-05-01 DIAGNOSIS — E669 Obesity, unspecified: Secondary | ICD-10-CM

## 2018-05-01 NOTE — Progress Notes (Addendum)
Pre-Operative Nutrition Class:  Appt start time: 0086   End time:  1830.  Patient was seen on 05/01/2018 for Pre-Operative Bariatric Surgery Education at [facility].   Surgery date:  Surgery type: RYGB Start weight at [facility]: 218.3 Weight today: 259.8  Samples given per MNT protocol. Patient educated on appropriate usage: Bariatric Advantage Multivitamin Lot # [id number] Exp: 4/21  Bariatric Advantage Calcium  Lot # [id number] Exp: 11/11/2018  Renee Pain Protein Shake Lot # [id number] Exp: march, 302020  The following the learning objectives were met by the patient during this course:  Identify Pre-Op Dietary Goals and will begin 2 weeks pre-operatively  Identify appropriate sources of fluids and proteins   State protein recommendations and appropriate sources pre and post-operatively  Identify Post-Operative Dietary Goals and will follow for 2 weeks post-operatively  Identify appropriate multivitamin and calcium sources  Describe the need for physical activity post-operatively and will follow MD recommendations  State when to call healthcare provider regarding medication questions or post-operative complications  Handouts given during class include:  Pre-Op Bariatric Surgery Diet Handout  Protein Shake Handout  Post-Op Bariatric Surgery Nutrition Handout  BEL[facility] Program Information Flyer  Support Group Information Flyer  [facility] Bariatric Supplements Price List  Follow-Up Plan: Patient will follow-up at [facility] 2 weeks post operatively for diet advancement per MD.

## 2018-05-10 ENCOUNTER — Ambulatory Visit (INDEPENDENT_AMBULATORY_CARE_PROVIDER_SITE_OTHER): Payer: Self-pay | Admitting: Physician Assistant

## 2018-05-17 ENCOUNTER — Ambulatory Visit (INDEPENDENT_AMBULATORY_CARE_PROVIDER_SITE_OTHER): Payer: No Typology Code available for payment source | Admitting: Physician Assistant

## 2018-05-17 ENCOUNTER — Encounter (INDEPENDENT_AMBULATORY_CARE_PROVIDER_SITE_OTHER): Payer: Self-pay | Admitting: Physician Assistant

## 2018-05-17 VITALS — BP 126/85 | HR 90 | Temp 98.1°F | Ht 63.0 in | Wt 252.0 lb

## 2018-05-17 DIAGNOSIS — R7303 Prediabetes: Secondary | ICD-10-CM

## 2018-05-17 DIAGNOSIS — Z6841 Body Mass Index (BMI) 40.0 and over, adult: Secondary | ICD-10-CM

## 2018-05-17 DIAGNOSIS — E559 Vitamin D deficiency, unspecified: Secondary | ICD-10-CM

## 2018-05-17 DIAGNOSIS — Z9189 Other specified personal risk factors, not elsewhere classified: Secondary | ICD-10-CM | POA: Diagnosis not present

## 2018-05-17 MED ORDER — VITAMIN D (ERGOCALCIFEROL) 1.25 MG (50000 UNIT) PO CAPS: 50000.0000 [IU] | ORAL_CAPSULE | ORAL | 0 refills | Status: AC

## 2018-05-17 MED FILL — VIT D2 1.25 MG (50,000 UNIT: 1.25 MG | 28 days supply | Qty: 4 | Fill #0

## 2018-05-17 NOTE — Progress Notes (Signed)
Office: [phone]  /  Fax: [phone]   HPI:   Chief Complaint: OBESITY Sydney Dominguez is here to discuss her progress with her obesity treatment plan. She is following our protein rich vegetarian plan and is following her eating plan approximately 50 % of the time. She states she is walking and swimming for 30 minutes 3 times per week. Sydney Dominguez reports that she is struggling with overeating on the plan. She is ready to get back on track.  Her weight is 252 lb (114.3 kg) today and has lost 0 lbs since her last visit. She has lost 2 lbs since starting treatment with us.  Vitamin D deficiency Sydney Dominguez has a diagnosis of vitamin D deficiency. She is currently taking prescription Vit D and denies nausea, vomiting or muscle weakness.  Pre-Diabetes Sydney Dominguez has a diagnosis of prediabetes based on her elevated Hgb A1c and was informed this puts her at greater risk of developing diabetes. She is not taking metformin currently and continues to work on diet and exercise to decrease risk of diabetes. She denies nausea or hypoglycemia. She reports polyphagia.  At risk for diabetes Sydney Dominguez is at higher than average risk for developing diabetes due to her obesity. She currently denies polyuria or polydipsia.  ASSESSMENT AND PLAN:  Vitamin D deficiency - Plan: Vitamin D, Ergocalciferol, (DRISDOL) 1.25 MG (50000 UT) CAPS capsule  Prediabetes  At risk for diabetes mellitus  Class 3 severe obesity with serious comorbidity and body mass index (BMI) of 40.0 to 44.9 in adult, unspecified obesity type (HCC)  PLAN:  Vitamin D Deficiency Sydney Dominguez was informed that low vitamin D levels contributes to fatigue and are associated with obesity, breast, and colon cancer. She agrees to continue to take prescription Vit D @50 ,000 IU every week #4 with no refills and will follow up for routine testing of vitamin D, at least 2-3 times per year. She was informed of the risk of over-replacement of vitamin D and  agrees to not increase her dose unless she discusses this with us first. Sydney Dominguez agrees to follow up with our clinic in 2 weeks.  Pre-Diabetes Sydney Dominguez will continue to work on weight loss, exercise, and decreasing simple carbohydrates in her diet to help decrease the risk of diabetes. We dicussed metformin including benefits and risks. She was informed that eating too many simple carbohydrates or too many calories at one sitting increases the likelihood of GI side effects. [name] declined metformin for now and a prescription was not written today. Sydney Dominguez agrees to follow up with our clinic in 2 weeks.  Diabetes risk counseling Sydney Dominguez was given extended (15 minutes) diabetes prevention counseling today. She is 28 y.o. female and has risk factors for diabetes including obesity. We discussed intensive lifestyle modifications today with an emphasis on weight loss as well as increasing exercise and decreasing simple carbohydrates in her diet.  Obesity Sydney Dominguez is currently in the action stage of change. As such, her goal is to continue with weight loss efforts She has agreed to follow the Category 2 plan Sydney Dominguez has been instructed to work up to a goal of 150 minutes of combined cardio and strengthening exercise per week for weight loss and overall health benefits. We discussed the following Behavioral Modification Strategies today: increasing lean protein intake and work on meal planning and easy cooking plans  Sydney Dominguez has agreed to follow up with our clinic in 2 weeks. She was informed of the importance of frequent follow up visits to maximize her success with intensive lifestyle  modifications for her multiple health conditions.  ALLERGIES: No Known Allergies  MEDICATIONS: Current Outpatient Medications on File Prior to Visit  Medication Sig Dispense Refill  . lamoTRIgine (LAMICTAL) 200 MG tablet Take 1 tablet (200 mg total) by mouth daily. 90 tablet 0  . metFORMIN (GLUCOPHAGE) 500 MG  tablet Take 1 tablet (500 mg total) by mouth daily with breakfast. 30 tablet 0  . Spirulina POWD 5 mg by Does not apply route 3 (three) times daily.    . Vitamin D, Ergocalciferol, (DRISDOL) 1.25 MG (50000 UT) CAPS capsule Take 1 capsule (50,000 Units total) by mouth every 7 (seven) days. 4 capsule 0   No current facility-administered medications on file prior to visit.     PAST MEDICAL HISTORY: Past Medical History:  Diagnosis Date  . Anemia   . Anxiety   . Asthma   . B12 deficiency   . Back pain   . Bipolar disorder (HCC)   . Chest pain   . Constipation   . Depression   . Dyspnea   . GERD (gastroesophageal reflux disease)   . Palpitations   . Prediabetes   . Schizophrenia (HCC)   . Vertigo   . Vitamin D deficiency     PAST SURGICAL HISTORY: Past Surgical History:  Procedure Laterality Date  . HAND SURGERY     l hand    SOCIAL HISTORY: Social History   Tobacco Use  . Smoking status: Never Smoker  . Smokeless tobacco: Never Used  Substance Use Topics  . Alcohol use: No    Alcohol/week: 0.0 standard drinks  . Drug use: No    FAMILY HISTORY: Family History  Problem Relation Age of Onset  . Diabetes Mother   . Hypertension Mother   . Thyroid disease Mother   . Depression Mother   . Anxiety disorder Mother   . Obesity Mother   . Hypertension Father   . Hyperlipidemia Father   . Depression Father   . Anxiety disorder Father   . Bipolar disorder Father   . Schizophrenia Father   . Alcoholism Father   . Eating disorder Father   . Hypertension Maternal Grandmother   . Cancer Maternal Grandfather        prostate  . Anesthesia problems Neg Hx     ROS: Review of Systems  Constitutional: Negative for weight loss.  Gastrointestinal: Negative for nausea and vomiting.  Genitourinary:       Negative for polyuria  Musculoskeletal:       Negative for muscle weakness  Endo/Heme/Allergies: Negative for polydipsia.       Negative for hypoglycemia Positive  for polyphagia    PHYSICAL EXAM: Blood pressure 126/85, pulse 90, temperature 98.1 F (36.7 C), temperature source Oral, height 5\' 3"  (1.6 m), weight 252 lb (114.3 kg), SpO2 100 %. Body mass index is 44.64 kg/m. Physical Exam Vitals signs reviewed.  Constitutional:      Appearance: Normal appearance. She is obese.  Cardiovascular:     Rate and Rhythm: Normal rate.     Pulses: Normal pulses.  Pulmonary:     Effort: Pulmonary effort is normal.  Musculoskeletal: Normal range of motion.  Skin:    General: Skin is warm and dry.  Neurological:     Mental Status: She is alert and oriented to person, place, and time.  Psychiatric:        Mood and Affect: Mood normal.        Behavior: Behavior normal.     RECENT  LABS AND TESTS: BMET    Component Value Date/Time   NA 143 02/20/2018 0845   K 4.3 02/20/2018 0845   CL 104 02/20/2018 0845   CO2 23 02/20/2018 0845   GLUCOSE 82 02/20/2018 0845   GLUCOSE 94 09/20/2012 1400   BUN 8 02/20/2018 0845   CREATININE 0.74 02/20/2018 0845   CALCIUM 9.6 02/20/2018 0845   GFRNONAA 111 02/20/2018 0845   GFRAA 127 02/20/2018 0845   Lab Results  Component Value Date   HGBA1C 5.5 02/20/2018   HGBA1C 5.5 09/20/2017   Lab Results  Component Value Date   INSULIN 17.7 02/20/2018   INSULIN 16.9 09/20/2017   CBC    Component Value Date/Time   WBC 5.2 09/20/2017 1033   WBC 7.8 09/20/2012 1400   RBC 4.58 09/20/2017 1033   RBC 4.79 09/20/2012 1400   HGB 11.8 09/20/2017 1033   HCT 36.7 09/20/2017 1033   PLT 371 09/20/2012 1400   MCV 80 09/20/2017 1033   MCH 25.8 (L) 09/20/2017 1033   MCH 28.4 09/20/2012 1400   MCHC 32.2 09/20/2017 1033   MCHC 34.2 09/20/2012 1400   RDW 16.8 (H) 09/20/2017 1033   LYMPHSABS 1.2 09/20/2017 1033   MONOABS 0.6 09/20/2012 1400   EOSABS 0.1 09/20/2017 1033   BASOSABS 0.0 09/20/2017 1033   Iron/TIBC/Ferritin/ %Sat No results found for: IRON, TIBC, FERRITIN, IRONPCTSAT Lipid Panel     Component Value  Date/Time   CHOL 192 02/20/2018 0845   TRIG 46 02/20/2018 0845   HDL 58 02/20/2018 0845   LDLCALC 125 (H) 02/20/2018 0845   Hepatic Function Panel     Component Value Date/Time   PROT 7.0 02/20/2018 0845   ALBUMIN 4.6 02/20/2018 0845   AST 13 02/20/2018 0845   ALT 14 02/20/2018 0845   ALKPHOS 62 02/20/2018 0845   BILITOT <0.2 02/20/2018 0845      Component Value Date/Time   TSH 0.794 09/20/2017 1033     Ref. Range 02/20/2018 08:45  Vitamin D, 25-Hydroxy Latest Ref Range: 30.0 - 100.0 ng/mL 24.4 (L)     OBESITY BEHAVIORAL INTERVENTION VISIT  Today's visit was # 9   Starting weight: 254 lbs Starting date: 09/20/2017 Today's weight :: 252 lbs Today's date: 05/17/2018 Total lbs lost to date: 2   ASK: We discussed the diagnosis of obesity with Rebecca Eaton today and Noble agreed to give [place] permission to discuss obesity behavioral modification therapy today.  ASSESS: Dniyah has the diagnosis of obesity and her BMI today is 44.65 Addilyne is in the action stage of change   ADVISE: Vianka was educated on the multiple health risks of obesity as well as the benefit of weight loss to improve her health. She was advised of the need for long term treatment and the importance of lifestyle modifications to improve her current health and to decrease her risk of future health problems.  AGREE: Multiple dietary modification options and treatment options were discussed and  Tywana agreed to follow the recommendations documented in the above note.  ARRANGE: Dayanna was educated on the importance of frequent visits to treat obesity as outlined per CMS and USPSTF guidelines and agreed to schedule her next follow up appointment today.  I, Tammy Wysor, am acting as Energy manager for [employer] I, Alois Cliche, PA-C have reviewed above note and agree with its content

## 2018-05-18 ENCOUNTER — Other Ambulatory Visit (INDEPENDENT_AMBULATORY_CARE_PROVIDER_SITE_OTHER): Payer: Self-pay | Admitting: Physician Assistant

## 2018-05-18 ENCOUNTER — Other Ambulatory Visit: Payer: Self-pay | Admitting: Student in an Organized Health Care Education/Training Program

## 2018-05-18 DIAGNOSIS — E8881 Metabolic syndrome: Secondary | ICD-10-CM

## 2018-05-18 DIAGNOSIS — F319 Bipolar disorder, unspecified: Secondary | ICD-10-CM

## 2018-05-18 MED ORDER — LAMOTRIGINE 200 MG PO TABS: 200.0000 mg | ORAL_TABLET | Freq: Every day | ORAL | 0 refills | Status: DC

## 2018-05-18 MED FILL — lamoTRIgine 200 MG TABS: 200 | 90 days supply | Qty: 90 | Fill #0

## 2018-05-31 ENCOUNTER — Encounter (INDEPENDENT_AMBULATORY_CARE_PROVIDER_SITE_OTHER): Payer: Self-pay

## 2018-05-31 ENCOUNTER — Ambulatory Visit (INDEPENDENT_AMBULATORY_CARE_PROVIDER_SITE_OTHER): Payer: Self-pay | Admitting: Physician Assistant

## 2018-06-23 ENCOUNTER — Ambulatory Visit: Payer: Self-pay | Admitting: General Surgery

## 2018-06-23 MED FILL — PANTOPRAZOLE SOD DR 40 MG T: 40 | 30 days supply | Qty: 30 | Fill #0

## 2018-06-23 MED FILL — ONDANSETRON ODT 4 MG TABLET: 4 | 4 days supply | Qty: 15 | Fill #0

## 2018-06-23 MED FILL — oxyCODONE HCL 5 MG/5ML SOLN: 5 | 2 days supply | Qty: 50 | Fill #0

## 2018-06-26 NOTE — Progress Notes (Signed)
09-20-17 (Epic) EKG

## 2018-06-26 NOTE — Patient Instructions (Addendum)
Sydney Dominguez  06/26/2018   Your procedure is scheduled on: 07-04-18    Report to [facility] Main  Entrance    Report to SHORT STAY at 5:30 AM    Call this number if you have problems the morning of surgery [phone]    Remember: MORNING OF SURGERY DRINK:  1 GATORADE BEFORE YOU LEAVE HOME, DRINK ALL OF IT AT ONE TIME.  NO SOLID FOOD AFTER 600 PM THE NIGHT BEFORE YOUR SURGERY. YOU MAY DRINK CLEAR FLUIDS. THE GATORADE YOU DRINK BEFORE YOU LEAVE HOME WILL BE THE LAST FLUIDS YOU DRINK BEFORE SURGERY.    CLEAR LIQUID DIET   Foods Allowed                                                                     Foods Excluded  Coffee and tea, regular and decaf                             liquids that you cannot  Plain Jell-O in any flavor                                             see through such as: Fruit ices (not with fruit pulp)                                     milk, soups, orange juice  Iced Popsicles                                    All solid food Carbonated beverages, regular and diet                                    Cranberry, grape and apple juices Sports drinks like Gatorade Lightly seasoned clear broth or consume(fat free) Sugar, honey syrup  Sample Menu Breakfast                                Lunch                                     Supper Cranberry juice                    Beef broth                            Chicken broth Jell-O                                     Grape  juice                           Apple juice Coffee or tea                        Jell-O                                      Popsicle                                                Coffee or tea                        Coffee or tea  _____________________________________________________________________   PAIN IS EXPECTED AFTER SURGERY AND WILL NOT BE COMPLETELY ELIMINATED. AMBULATION AND TYLENOL WILL HELP REDUCE INCISIONAL AND GAS PAIN. MOVEMENT IS KEY!  YOU ARE  EXPECTED TO BE OUT OF BED WITHIN 4 HOURS OF ADMISSION TO YOUR PATIENT ROOM.  SITTING IN THE RECLINER THROUGHOUT THE DAY IS IMPORTANT FOR DRINKING FLUIDS AND MOVING GAS THROUGHOUT THE GI TRACT.  COMPRESSION STOCKINGS SHOULD BE WORN [facility] STAY UNLESS YOU ARE WALKING.   INCENTIVE SPIROMETER SHOULD BE USED EVERY HOUR WHILE AWAKE TO DECREASE POST-OPERATIVE COMPLICATIONS SUCH AS PNEUMONIA.  WHEN DISCHARGED HOME, IT IS IMPORTANT TO CONTINUE TO WALK EVERY HOUR AND USE THE INCENTIVE SPIROMETER EVERY HOUR.     BRUSH YOUR TEETH MORNING OF SURGERY AND RINSE YOUR MOUTH OUT, NO CHEWING GUM CANDY OR MINTS.     Take these medicines the morning of surgery with A SIP OF WATER: None                                You may not have any metal on your body including hair pins and              piercings  Do not wear jewelry, make-up, lotions, powders or perfumes, deodorant             Do not wear nail polish.  Do not shave  48 hours prior to surgery.                Do not bring valuables to the hospital. [facility] IS NOT             RESPONSIBLE   FOR VALUABLES.  Contacts, dentures or bridgework may not be worn into surgery.  Leave suitcase in the car. After surgery it may be brought to your room.     Patients discharged the day of surgery will not be allowed to drive home. IF YOU ARE HAVING SURGERY AND GOING HOME THE SAME DAY, YOU MUST HAVE AN ADULT TO DRIVE YOU HOME AND BE WITH YOU FOR 24 HOURS. YOU MAY GO HOME BY TAXI OR UBER OR ORTHERWISE, BUT AN ADULT MUST ACCOMPANY YOU HOME AND STAY WITH YOU FOR 24 HOURS.    Special Instructions: N/A              Please read over the following fact sheets you were given: _____________________________________________________________________  [name] - Preparing for Surgery Before surgery, you can play an important role.  Because skin is not sterile, your skin needs to be as free of germs as possible.  You can reduce the number  of germs on your skin by washing with CHG (chlorahexidine gluconate) soap before surgery.  CHG is an antiseptic cleaner which kills germs and bonds with the skin to continue killing germs even after washing. Please DO NOT use if you have an allergy to CHG or antibacterial soaps.  If your skin becomes reddened/irritated stop using the CHG and inform your nurse when you arrive at Short Stay. Do not shave (including legs and underarms) for at least 48 hours prior to the first CHG shower.  You may shave your face/neck. Please follow these instructions carefully:  1.  Shower with CHG Soap the night before surgery and the  morning of Surgery.  2.  If you choose to wash your hair, wash your hair first as usual with your  normal  shampoo.  3.  After you shampoo, rinse your hair and body thoroughly to remove the  shampoo.                           4.  Use CHG as you would any other liquid soap.  You can apply chg directly  to the skin and wash                       Gently with a scrungie or clean washcloth.  5.  Apply the CHG Soap to your body ONLY FROM THE NECK DOWN.   Do not use on face/ open                           Wound or open sores. Avoid contact with eyes, ears mouth and genitals (private parts).                       Wash face,  Genitals (private parts) with your normal soap.             6.  Wash thoroughly, paying special attention to the area where your surgery  will be performed.  7.  Thoroughly rinse your body with warm water from the neck down.  8.  DO NOT shower/wash with your normal soap after using and rinsing off  the CHG Soap.                9.  Pat yourself dry with a clean towel.            10.  Wear clean pajamas.            11.  Place clean sheets on your bed the night of your first shower and do not  sleep with pets. Day of Surgery : Do not apply any lotions/deodorants the morning of surgery.  Please wear clean clothes to the hospital/surgery center.  FAILURE TO FOLLOW THESE  INSTRUCTIONS MAY RESULT IN THE CANCELLATION OF YOUR SURGERY PATIENT SIGNATURE_________________________________  NURSE SIGNATURE__________________________________  ________________________________________________________________________   Adam Phenix  An incentive spirometer is a tool that can help keep your lungs clear and active. This tool measures how well you are filling your lungs with each breath. Taking long deep breaths may help reverse or decrease the chance of developing breathing (pulmonary) problems (especially infection) following:  A long period of  time when you are unable to move or be active. BEFORE THE PROCEDURE   If the spirometer includes an indicator to show your best effort, your nurse or respiratory therapist will set it to a desired goal.  If possible, sit up straight or lean slightly forward. Try not to slouch.  Hold the incentive spirometer in an upright position. INSTRUCTIONS FOR USE  1. Sit on the edge of your bed if possible, or sit up as far as you can in bed or on a chair. 2. Hold the incentive spirometer in an upright position. 3. Breathe out normally. 4. Place the mouthpiece in your mouth and seal your lips tightly around it. 5. Breathe in slowly and as deeply as possible, raising the piston or the ball toward the top of the column. 6. Hold your breath for 3-5 seconds or for as long as possible. Allow the piston or ball to fall to the bottom of the column. 7. Remove the mouthpiece from your mouth and breathe out normally. 8. Rest for a few seconds and repeat Steps 1 through 7 at least 10 times every 1-2 hours when you are awake. Take your time and take a few normal breaths between deep breaths. 9. The spirometer may include an indicator to show your best effort. Use the indicator as a goal to work toward during each repetition. 10. After each set of 10 deep breaths, practice coughing to be sure your lungs are clear. If you have an incision (the  cut made at the time of surgery), support your incision when coughing by placing a pillow or rolled up towels firmly against it. Once you are able to get out of bed, walk around indoors and cough well. You may stop using the incentive spirometer when instructed by your caregiver.  RISKS AND COMPLICATIONS  Take your time so you do not get dizzy or light-headed.  If you are in pain, you may need to take or ask for pain medication before doing incentive spirometry. It is harder to take a deep breath if you are having pain. AFTER USE  Rest and breathe slowly and easily.  It can be helpful to keep track of a log of your progress. Your caregiver can provide you with a simple table to help with this. If you are using the spirometer at home, follow these instructions: [place] IF:   You are having difficultly using the spirometer.  You have trouble using the spirometer as often as instructed.  Your pain medication is not giving enough relief while using the spirometer.  You develop fever of 100.5 F (38.1 C) or higher. SEEK IMMEDIATE MEDICAL CARE IF:   You cough up bloody sputum that had not been present before.  You develop fever of 102 F (38.9 C) or greater.  You develop worsening pain at or near the incision site. MAKE SURE YOU:   Understand these instructions.  Will watch your condition.  Will get help right away if you are not doing well or get worse. Document Released: 08/16/2006 Document Revised: 06/28/2011 Document Reviewed: 10/17/2006 Exit[employer] Patient Information 2014 [employer], [place].   ________________________________________________________________________  WHAT IS A BLOOD TRANSFUSION? Blood Transfusion Information  A transfusion is the replacement of blood or some of its parts. Blood is made up of multiple cells which provide different functions.  Red blood cells carry oxygen and are used for blood loss replacement.  White blood cells fight against  infection.  Platelets control bleeding.  Plasma helps clot blood.  Other blood products are available for specialized needs, such as hemophilia or other clotting disorders. BEFORE THE TRANSFUSION  Who gives blood for transfusions?   Healthy volunteers who are fully evaluated to make sure their blood is safe. This is blood bank blood. Transfusion therapy is the safest it has ever been in the practice of medicine. Before blood is taken from a donor, a complete history is taken to make sure that person has no history of diseases nor engages in risky social behavior (examples are intravenous drug use or sexual activity with multiple partners). The donor's travel history is screened to minimize risk of transmitting infections, such as malaria. The donated blood is tested for signs of infectious diseases, such as HIV and hepatitis. The blood is then tested to be sure it is compatible with you in order to minimize the chance of a transfusion reaction. If you or a relative donates blood, this is often done in anticipation of surgery and is not appropriate for emergency situations. It takes many days to process the donated blood. RISKS AND COMPLICATIONS Although transfusion therapy is very safe and saves many lives, the main dangers of transfusion include:   Getting an infectious disease.  Developing a transfusion reaction. This is an allergic reaction to something in the blood you were given. Every precaution is taken to prevent this. The decision to have a blood transfusion has been considered carefully by your caregiver before blood is given. Blood is not given unless the benefits outweigh the risks. AFTER THE TRANSFUSION  Right after receiving a blood transfusion, you will usually feel much better and more energetic. This is especially true if your red blood cells have gotten low (anemic). The transfusion raises the level of the red blood cells which carry oxygen, and this usually causes an energy  increase.  The nurse administering the transfusion will monitor you carefully for complications. HOME CARE INSTRUCTIONS  No special instructions are needed after a transfusion. You may find your energy is better. Speak with your caregiver about any limitations on activity for underlying diseases you may have. SEEK MEDICAL CARE IF:   Your condition is not improving after your transfusion.  You develop redness or irritation at the intravenous (IV) site. SEEK IMMEDIATE MEDICAL CARE IF:  Any of the following symptoms occur over the next 12 hours:  Shaking chills.  You have a temperature by mouth above 102 F (38.9 C), not controlled by medicine.  Chest, back, or muscle pain.  People around you feel you are not acting correctly or are confused.  Shortness of breath or difficulty breathing.  Dizziness and fainting.  You get a rash or develop hives.  You have a decrease in urine output.  Your urine turns a dark color or changes to pink, red, or brown. Any of the following symptoms occur over the next 10 days:  You have a temperature by mouth above 102 F (38.9 C), not controlled by medicine.  Shortness of breath.  Weakness after normal activity.  The white part of the eye turns yellow (jaundice).  You have a decrease in the amount of urine or are urinating less often.  Your urine turns a dark color or changes to pink, red, or brown. Document Released: 04/02/2000 Document Revised: 06/28/2011 Document Reviewed: 11/20/2007 [facility] Patient Information 2014 [place], [place].  _______________________________________________________________________

## 2018-06-28 ENCOUNTER — Encounter (HOSPITAL_COMMUNITY)
Admission: RE | Admit: 2018-06-28 | Discharge: 2018-06-28 | Disposition: A | Discharge status: Home / Self Care | Payer: No Typology Code available for payment source | Source: Ambulatory Visit | Attending: General Surgery | Admitting: General Surgery

## 2018-06-28 ENCOUNTER — Other Ambulatory Visit: Payer: Self-pay

## 2018-06-28 ENCOUNTER — Encounter (HOSPITAL_COMMUNITY): Payer: Self-pay

## 2018-06-28 DIAGNOSIS — Z01812 Encounter for preprocedural laboratory examination: Secondary | ICD-10-CM | POA: Diagnosis present

## 2018-06-28 DIAGNOSIS — Z6841 Body Mass Index (BMI) 40.0 and over, adult: Secondary | ICD-10-CM | POA: Insufficient documentation

## 2018-06-28 LAB — COMPREHENSIVE METABOLIC PANEL
ALT: 21 U/L (ref 0–44)
AST: 16 U/L (ref 15–41)
Albumin: 4.2 g/dL (ref 3.5–5.0)
Alkaline Phosphatase: 53 U/L (ref 38–126)
Anion gap: 11 (ref 5–15)
BUN: 8 mg/dL (ref 6–20)
CO2: 26 mmol/L (ref 22–32)
Calcium: 9.8 mg/dL (ref 8.9–10.3)
Chloride: 103 mmol/L (ref 98–111)
Creatinine, Ser: 0.66 mg/dL (ref 0.44–1.00)
GFR calc Af Amer: >60 mL/min (ref >60)
Glucose, Bld: 95 mg/dL (ref 70–99)
Potassium: 3.7 mmol/L (ref 3.5–5.1)
Sodium: 140 mmol/L (ref 135–145)
Total Bilirubin: 0.4 mg/dL (ref 0.3–1.2)
Total Protein: 7.5 g/dL (ref 6.5–8.1)

## 2018-06-28 LAB — CBC WITH DIFFERENTIAL/PLATELET
Abs Immature Granulocytes: 0.01 10*3/uL (ref 0.00–0.07)
BASOS PCT: 0 %
Basophils Absolute: 0 10*3/uL (ref 0.0–0.1)
EOS ABS: 0.1 10*3/uL (ref 0.0–0.5)
Eosinophils Relative: 1 %
HCT: 41 % (ref 36.0–46.0)
Hemoglobin: 12.1 g/dL (ref 12.0–15.0)
Immature Granulocytes: 0 %
Lymphocytes Relative: 21 %
Lymphs Abs: 1 10*3/uL (ref 0.7–4.0)
MCH: 24.4 pg — ABNORMAL LOW (ref 26.0–34.0)
MCHC: 29.5 g/dL — AB (ref 30.0–36.0)
MCV: 82.7 fL (ref 80.0–100.0)
Monocytes Absolute: 0.4 10*3/uL (ref 0.1–1.0)
Monocytes Relative: 8 %
Neutro Abs: 3.3 10*3/uL (ref 1.7–7.7)
Neutrophils Relative %: 70 %
PLATELETS: 400 10*3/uL (ref 150–400)
RBC: 4.96 MIL/uL (ref 3.87–5.11)
RDW: 20.9 % — ABNORMAL HIGH (ref 11.5–15.5)
WBC: 4.7 10*3/uL (ref 4.0–10.5)
nRBC: 0 % (ref 0.0–0.2)

## 2018-06-28 LAB — ABO/RH: ABO/RH(D): O POS

## 2018-06-28 LAB — HEMOGLOBIN A1C
Hgb A1c MFr Bld: 5.2 % (ref 4.8–5.6)
Mean Plasma Glucose: 102.54 mg/dL

## 2018-06-29 LAB — TYPE AND SCREEN
ABO/RH(D): O POS
ANTIBODY SCREEN: NEGATIVE

## 2018-06-29 LAB — NO BLOOD PRODUCTS

## 2018-06-29 NOTE — Progress Notes (Signed)
Refusal of All Blood And/Or Blood Products faxed to Dr. Jamse Mead office. Successful transmittal to Dr. Jamse Mead office on chart.  Refusal of All Blood And/Or Blood Product taken to [employer].

## 2018-07-03 MED ORDER — BUPIVACAINE LIPOSOME 1.3 % IJ SUSP
20.0000 mL | Freq: Once | INTRAMUSCULAR | Status: DC
  Filled 2018-07-03: qty 20

## 2018-07-03 NOTE — Anesthesia Preprocedure Evaluation (Addendum)
Anesthesia Evaluation  Patient identified by MRN, date of birth, ID band Patient awake    Reviewed: Allergy & Precautions, NPO status , Patient's Chart, lab work & pertinent test results  History of Anesthesia Complications Negative for: history of anesthetic complications  Airway Mallampati: II  TM Distance: >3 FB Neck ROM: Full    Dental no notable dental hx. (+) Dental Advisory Given   Pulmonary asthma ,    Pulmonary exam normal        Cardiovascular negative cardio ROS Normal cardiovascular exam     Neuro/Psych PSYCHIATRIC DISORDERS Anxiety Depression Bipolar Disorder Schizophrenia negative neurological ROS     GI/Hepatic Neg liver ROS, GERD  ,  Endo/Other  Morbid obesity  Renal/GU negative Renal ROS     Musculoskeletal negative musculoskeletal ROS (+)   Abdominal   Peds  Hematology negative hematology ROS (+)   Anesthesia Other Findings Day of surgery medications reviewed with the patient.  Reproductive/Obstetrics                            Anesthesia Physical Anesthesia Plan  ASA: III  Anesthesia Plan: General   Post-op Pain Management:    Induction: Intravenous  PONV Risk Score and Plan: 4 or greater and Ondansetron, Dexamethasone, Scopolamine patch - Pre-op and Diphenhydramine  Airway Management Planned: Oral ETT  Additional Equipment:   Intra-op Plan:   Post-operative Plan: Extubation in OR  Informed Consent: I have reviewed the patients History and Physical, chart, labs and discussed the procedure including the risks, benefits and alternatives for the proposed anesthesia with the patient or authorized representative who has indicated his/her understanding and acceptance.     Dental advisory given  Plan Discussed with: Anesthesiologist, CRNA and Surgeon  Anesthesia Plan Comments:        Anesthesia Quick Evaluation

## 2018-07-04 ENCOUNTER — Other Ambulatory Visit: Payer: Self-pay

## 2018-07-04 ENCOUNTER — Inpatient Hospital Stay (HOSPITAL_COMMUNITY): Payer: No Typology Code available for payment source | Admitting: Physician Assistant

## 2018-07-04 ENCOUNTER — Encounter (HOSPITAL_COMMUNITY)
Admission: RE | Disposition: A | Discharge status: Home / Self Care | Payer: Self-pay | Source: Home / Self Care | Attending: General Surgery

## 2018-07-04 ENCOUNTER — Inpatient Hospital Stay (HOSPITAL_COMMUNITY)
Admission: RE | Admit: 2018-07-04 | Discharge: 2018-07-05 | DRG: 621 | Disposition: A | Discharge status: Home / Self Care | Payer: No Typology Code available for payment source | Attending: General Surgery | Admitting: General Surgery

## 2018-07-04 ENCOUNTER — Inpatient Hospital Stay (HOSPITAL_COMMUNITY): Payer: No Typology Code available for payment source | Admitting: Anesthesiology

## 2018-07-04 ENCOUNTER — Encounter (HOSPITAL_COMMUNITY): Payer: Self-pay | Admitting: Anesthesiology

## 2018-07-04 DIAGNOSIS — Z6841 Body Mass Index (BMI) 40.0 and over, adult: Secondary | ICD-10-CM | POA: Diagnosis not present

## 2018-07-04 DIAGNOSIS — K219 Gastro-esophageal reflux disease without esophagitis: Secondary | ICD-10-CM | POA: Diagnosis present

## 2018-07-04 DIAGNOSIS — Z8249 Family history of ischemic heart disease and other diseases of the circulatory system: Secondary | ICD-10-CM

## 2018-07-04 DIAGNOSIS — Z7984 Long term (current) use of oral hypoglycemic drugs: Secondary | ICD-10-CM

## 2018-07-04 DIAGNOSIS — N393 Stress incontinence (female) (male): Secondary | ICD-10-CM | POA: Diagnosis present

## 2018-07-04 DIAGNOSIS — E785 Hyperlipidemia, unspecified: Secondary | ICD-10-CM | POA: Diagnosis present

## 2018-07-04 DIAGNOSIS — Z8261 Family history of arthritis: Secondary | ICD-10-CM | POA: Diagnosis not present

## 2018-07-04 DIAGNOSIS — F419 Anxiety disorder, unspecified: Secondary | ICD-10-CM | POA: Diagnosis present

## 2018-07-04 DIAGNOSIS — Z79899 Other long term (current) drug therapy: Secondary | ICD-10-CM

## 2018-07-04 DIAGNOSIS — R7303 Prediabetes: Secondary | ICD-10-CM | POA: Diagnosis present

## 2018-07-04 HISTORY — PX: GASTRIC ROUX-EN-Y: SHX5262

## 2018-07-04 LAB — HEMOGLOBIN AND HEMATOCRIT, BLOOD
HCT: 40.6 % (ref 36.0–46.0)
Hemoglobin: 12.2 g/dL (ref 12.0–15.0)

## 2018-07-04 LAB — PREGNANCY, URINE: Preg Test, Ur: NEGATIVE

## 2018-07-04 SURGERY — LAPAROSCOPIC ROUX-EN-Y GASTRIC BYPASS WITH UPPER ENDOSCOPY
Anesthesia: General | Site: Abdomen

## 2018-07-04 MED ORDER — PROMETHAZINE HCL 25 MG/ML IJ SOLN: 6.2500 mg | INTRAMUSCULAR | Status: DC | PRN

## 2018-07-04 MED ORDER — DIPHENHYDRAMINE HCL 50 MG/ML IJ SOLN
INTRAMUSCULAR | Status: DC | PRN
  Administered 2018-07-04: 12.5 mg via INTRAVENOUS

## 2018-07-04 MED ORDER — ONDANSETRON HCL 4 MG/2ML IJ SOLN
INTRAMUSCULAR | Status: DC | PRN
  Administered 2018-07-04: 4 mg via INTRAVENOUS

## 2018-07-04 MED ORDER — BUPIVACAINE LIPOSOME 1.3 % IJ SUSP
INTRAMUSCULAR | Status: DC | PRN
  Administered 2018-07-04: 20 mL

## 2018-07-04 MED ORDER — LIDOCAINE 2% (20 MG/ML) 5 ML SYRINGE
INTRAMUSCULAR | Status: AC
  Filled 2018-07-04: qty 5

## 2018-07-04 MED ORDER — POTASSIUM CHLORIDE IN NACL 20-0.9 MEQ/L-% IV SOLN
INTRAVENOUS | Status: DC
  Administered 2018-07-04 – 2018-07-05 (×3): via INTRAVENOUS
  Filled 2018-07-04 (×3): qty 1000

## 2018-07-04 MED ORDER — BUPIVACAINE-EPINEPHRINE (PF) 0.25% -1:200000 IJ SOLN
INTRAMUSCULAR | Status: AC
  Filled 2018-07-04: qty 30

## 2018-07-04 MED ORDER — PROPOFOL 10 MG/ML IV BOLUS
INTRAVENOUS | Status: AC
  Filled 2018-07-04: qty 40

## 2018-07-04 MED ORDER — CELECOXIB 200 MG PO CAPS
400.0000 mg | ORAL_CAPSULE | ORAL | Status: AC
  Administered 2018-07-04: 400 mg via ORAL
  Filled 2018-07-04: qty 2

## 2018-07-04 MED ORDER — SUCCINYLCHOLINE CHLORIDE 200 MG/10ML IV SOSY
PREFILLED_SYRINGE | INTRAVENOUS | Status: AC
  Filled 2018-07-04: qty 20

## 2018-07-04 MED ORDER — APREPITANT 40 MG PO CAPS
40.0000 mg | ORAL_CAPSULE | ORAL | Status: AC
  Administered 2018-07-04: 40 mg via ORAL
  Filled 2018-07-04: qty 1

## 2018-07-04 MED ORDER — OXYCODONE HCL 5 MG/5ML PO SOLN
5.0000 mg | ORAL | Status: DC | PRN
  Administered 2018-07-04 – 2018-07-05 (×3): 10 mg via ORAL
  Filled 2018-07-04 (×3): qty 10

## 2018-07-04 MED ORDER — MIDAZOLAM HCL 2 MG/2ML IJ SOLN
INTRAMUSCULAR | Status: AC
  Filled 2018-07-04: qty 2

## 2018-07-04 MED ORDER — ROCURONIUM BROMIDE 10 MG/ML (PF) SYRINGE
PREFILLED_SYRINGE | INTRAVENOUS | Status: AC
  Filled 2018-07-04: qty 10

## 2018-07-04 MED ORDER — SODIUM CHLORIDE (PF) 0.9 % IJ SOLN
INTRAMUSCULAR | Status: AC
  Filled 2018-07-04: qty 50

## 2018-07-04 MED ORDER — BUPIVACAINE-EPINEPHRINE 0.25% -1:200000 IJ SOLN
INTRAMUSCULAR | Status: DC | PRN
  Administered 2018-07-04: 30 mL

## 2018-07-04 MED ORDER — CHLORHEXIDINE GLUCONATE CLOTH 2 % EX PADS: 6.0000 | MEDICATED_PAD | Freq: Once | CUTANEOUS | Status: DC

## 2018-07-04 MED ORDER — SCOPOLAMINE 1 MG/3DAYS TD PT72: 1.0000 | MEDICATED_PATCH | TRANSDERMAL | Status: DC

## 2018-07-04 MED ORDER — SCOPOLAMINE 1 MG/3DAYS TD PT72
1.0000 | MEDICATED_PATCH | TRANSDERMAL | Status: DC
  Administered 2018-07-04: 1.5 mg via TRANSDERMAL
  Filled 2018-07-04: qty 1

## 2018-07-04 MED ORDER — FENTANYL CITRATE (PF) 100 MCG/2ML IJ SOLN
INTRAMUSCULAR | Status: AC
  Filled 2018-07-04: qty 2

## 2018-07-04 MED ORDER — PANTOPRAZOLE SODIUM 40 MG IV SOLR
40.0000 mg | Freq: Every day | INTRAVENOUS | Status: DC
  Administered 2018-07-04: 40 mg via INTRAVENOUS
  Filled 2018-07-04: qty 40

## 2018-07-04 MED ORDER — LACTATED RINGERS IV SOLN
INTRAVENOUS | Status: DC
  Administered 2018-07-04 (×2): via INTRAVENOUS

## 2018-07-04 MED ORDER — STERILE WATER FOR IRRIGATION IR SOLN
Status: DC | PRN
  Administered 2018-07-04: 1000 mL

## 2018-07-04 MED ORDER — GLYCOPYRROLATE PF 0.2 MG/ML IJ SOSY
PREFILLED_SYRINGE | INTRAMUSCULAR | Status: AC
  Filled 2018-07-04: qty 1

## 2018-07-04 MED ORDER — EPHEDRINE SULFATE-NACL 50-0.9 MG/10ML-% IV SOSY
PREFILLED_SYRINGE | INTRAVENOUS | Status: DC | PRN
  Administered 2018-07-04 (×2): 5 mg via INTRAVENOUS

## 2018-07-04 MED ORDER — DEXAMETHASONE SODIUM PHOSPHATE 4 MG/ML IJ SOLN: 4.0000 mg | INTRAMUSCULAR | Status: DC

## 2018-07-04 MED ORDER — PHENYLEPHRINE 40 MCG/ML (10ML) SYRINGE FOR IV PUSH (FOR BLOOD PRESSURE SUPPORT)
PREFILLED_SYRINGE | INTRAVENOUS | Status: DC | PRN
  Administered 2018-07-04 (×2): 80 ug via INTRAVENOUS
  Administered 2018-07-04: 40 ug via INTRAVENOUS
  Administered 2018-07-04 (×6): 80 ug via INTRAVENOUS
  Administered 2018-07-04: 40 ug via INTRAVENOUS
  Administered 2018-07-04: 80 ug via INTRAVENOUS

## 2018-07-04 MED ORDER — EVICEL 5 ML EX KIT
PACK | Freq: Once | CUTANEOUS | Status: AC
  Administered 2018-07-04: 5 mL
  Filled 2018-07-04: qty 1

## 2018-07-04 MED ORDER — LACTATED RINGERS IV SOLN: INTRAVENOUS | Status: DC | PRN

## 2018-07-04 MED ORDER — ENOXAPARIN SODIUM 30 MG/0.3ML ~~LOC~~ SOLN
30.0000 mg | Freq: Two times a day (BID) | SUBCUTANEOUS | Status: DC
  Administered 2018-07-05: 30 mg via SUBCUTANEOUS
  Filled 2018-07-04: qty 0.3

## 2018-07-04 MED ORDER — ACETAMINOPHEN 160 MG/5ML PO SOLN
650.0000 mg | Freq: Four times a day (QID) | ORAL | Status: DC
  Administered 2018-07-04 – 2018-07-05 (×2): 650 mg via ORAL
  Filled 2018-07-04 (×2): qty 20.3

## 2018-07-04 MED ORDER — FENTANYL CITRATE (PF) 100 MCG/2ML IJ SOLN
INTRAMUSCULAR | Status: DC | PRN
  Administered 2018-07-04: 50 ug via INTRAVENOUS
  Administered 2018-07-04: 100 ug via INTRAVENOUS

## 2018-07-04 MED ORDER — MIDAZOLAM HCL 5 MG/5ML IJ SOLN
INTRAMUSCULAR | Status: DC | PRN
  Administered 2018-07-04: 2 mg via INTRAVENOUS

## 2018-07-04 MED ORDER — LIDOCAINE 2% (20 MG/ML) 5 ML SYRINGE
INTRAMUSCULAR | Status: DC | PRN
  Administered 2018-07-04: 60 mg via INTRAVENOUS

## 2018-07-04 MED ORDER — LIDOCAINE HCL 2 % IJ SOLN
INTRAMUSCULAR | Status: AC
  Filled 2018-07-04: qty 20

## 2018-07-04 MED ORDER — DEXAMETHASONE SODIUM PHOSPHATE 10 MG/ML IJ SOLN
INTRAMUSCULAR | Status: DC | PRN
  Administered 2018-07-04: 10 mg via INTRAVENOUS

## 2018-07-04 MED ORDER — DEXAMETHASONE SODIUM PHOSPHATE 10 MG/ML IJ SOLN
INTRAMUSCULAR | Status: AC
  Filled 2018-07-04: qty 1

## 2018-07-04 MED ORDER — FENTANYL CITRATE (PF) 100 MCG/2ML IJ SOLN
INTRAMUSCULAR | Status: AC
  Administered 2018-07-04: 50 ug
  Filled 2018-07-04: qty 4

## 2018-07-04 MED ORDER — DIPHENHYDRAMINE HCL 50 MG/ML IJ SOLN
INTRAMUSCULAR | Status: AC
  Filled 2018-07-04: qty 1

## 2018-07-04 MED ORDER — KETAMINE HCL 10 MG/ML IJ SOLN
INTRAMUSCULAR | Status: DC | PRN
  Administered 2018-07-04: 30 mg via INTRAVENOUS
  Administered 2018-07-04: 10 mg via INTRAVENOUS

## 2018-07-04 MED ORDER — PROPOFOL 10 MG/ML IV BOLUS
INTRAVENOUS | Status: DC | PRN
  Administered 2018-07-04: 200 mg via INTRAVENOUS

## 2018-07-04 MED ORDER — ACETAMINOPHEN 500 MG PO TABS
1000.0000 mg | ORAL_TABLET | ORAL | Status: AC
  Administered 2018-07-04: 1000 mg via ORAL
  Filled 2018-07-04: qty 2

## 2018-07-04 MED ORDER — SODIUM CHLORIDE (PF) 0.9 % IJ SOLN
INTRAMUSCULAR | Status: DC | PRN
  Administered 2018-07-04: 50 mL

## 2018-07-04 MED ORDER — ROCURONIUM BROMIDE 10 MG/ML (PF) SYRINGE
PREFILLED_SYRINGE | INTRAVENOUS | Status: AC
  Filled 2018-07-04: qty 20

## 2018-07-04 MED ORDER — LACTATED RINGERS IR SOLN
Status: DC | PRN
  Administered 2018-07-04: 1000 mL

## 2018-07-04 MED ORDER — ROCURONIUM BROMIDE 10 MG/ML (PF) SYRINGE
PREFILLED_SYRINGE | INTRAVENOUS | Status: DC | PRN
  Administered 2018-07-04 (×2): 20 mg via INTRAVENOUS
  Administered 2018-07-04: 10 mg via INTRAVENOUS
  Administered 2018-07-04: 100 mg via INTRAVENOUS
  Administered 2018-07-04 (×2): 10 mg via INTRAVENOUS

## 2018-07-04 MED ORDER — SODIUM CHLORIDE 0.9 % IV SOLN
2.0000 g | INTRAVENOUS | Status: AC
  Administered 2018-07-04: 2 g via INTRAVENOUS
  Filled 2018-07-04: qty 2

## 2018-07-04 MED ORDER — PHENYLEPHRINE 40 MCG/ML (10ML) SYRINGE FOR IV PUSH (FOR BLOOD PRESSURE SUPPORT)
PREFILLED_SYRINGE | INTRAVENOUS | Status: AC
  Filled 2018-07-04: qty 10

## 2018-07-04 MED ORDER — GABAPENTIN 300 MG PO CAPS
300.0000 mg | ORAL_CAPSULE | ORAL | Status: AC
  Administered 2018-07-04: 300 mg via ORAL
  Filled 2018-07-04: qty 1

## 2018-07-04 MED ORDER — KETAMINE HCL 10 MG/ML IJ SOLN
INTRAMUSCULAR | Status: AC
  Filled 2018-07-04: qty 1

## 2018-07-04 MED ORDER — ONDANSETRON HCL 4 MG/2ML IJ SOLN
INTRAMUSCULAR | Status: AC
  Filled 2018-07-04: qty 2

## 2018-07-04 MED ORDER — SUGAMMADEX SODIUM 500 MG/5ML IV SOLN
INTRAVENOUS | Status: AC
  Filled 2018-07-04: qty 10

## 2018-07-04 MED ORDER — EPHEDRINE 5 MG/ML INJ
INTRAVENOUS | Status: AC
  Filled 2018-07-04: qty 10

## 2018-07-04 MED ORDER — ENSURE MAX PROTEIN PO LIQD
2.0000 [oz_av] | ORAL | Status: DC
  Administered 2018-07-05 (×2): 2 [oz_av] via ORAL

## 2018-07-04 MED ORDER — ONDANSETRON HCL 4 MG/2ML IJ SOLN
4.0000 mg | INTRAMUSCULAR | Status: DC | PRN
  Administered 2018-07-04: 4 mg via INTRAVENOUS
  Filled 2018-07-04: qty 2

## 2018-07-04 MED ORDER — LIDOCAINE 2% (20 MG/ML) 5 ML SYRINGE
INTRAMUSCULAR | Status: DC | PRN
  Administered 2018-07-04: 1.5 mg/kg/h via INTRAVENOUS

## 2018-07-04 MED ORDER — HEPARIN SODIUM (PORCINE) 5000 UNIT/ML IJ SOLN
5000.0000 [IU] | INTRAMUSCULAR | Status: AC
  Administered 2018-07-04: 5000 [IU] via SUBCUTANEOUS
  Filled 2018-07-04: qty 1

## 2018-07-04 MED ORDER — GLYCOPYRROLATE PF 0.2 MG/ML IJ SOSY
PREFILLED_SYRINGE | INTRAMUSCULAR | Status: DC | PRN
  Administered 2018-07-04: .2 mg via INTRAVENOUS

## 2018-07-04 MED ORDER — SUGAMMADEX SODIUM 500 MG/5ML IV SOLN
INTRAVENOUS | Status: DC | PRN
  Administered 2018-07-04: 350 mg via INTRAVENOUS

## 2018-07-04 MED ORDER — FENTANYL CITRATE (PF) 100 MCG/2ML IJ SOLN
25.0000 ug | INTRAMUSCULAR | Status: DC | PRN
  Administered 2018-07-04: 50 ug via INTRAVENOUS

## 2018-07-04 MED ORDER — MORPHINE SULFATE (PF) 4 MG/ML IV SOLN
1.0000 mg | INTRAVENOUS | Status: DC | PRN
  Administered 2018-07-04 (×3): 2 mg via INTRAVENOUS
  Filled 2018-07-04 (×3): qty 1

## 2018-07-04 MED ORDER — SODIUM CHLORIDE 0.9 % IR SOLN
Status: DC | PRN
  Administered 2018-07-04: 1000 mL

## 2018-07-04 SURGICAL SUPPLY — 76 items
ADH SKN CLS APL DERMABOND .7 (GAUZE/BANDAGES/DRESSINGS) ×1
APL SWBSTK 6 STRL LF DISP (MISCELLANEOUS) ×2
APPLICATOR COTTON TIP 6 STRL (MISCELLANEOUS) ×2 IMPLANT
APPLICATOR COTTON TIP 6IN STRL (MISCELLANEOUS) ×6
APPLIER CLIP ROT 10 11.4 M/L (STAPLE)
APPLIER CLIP ROT 13.4 12 LRG (CLIP)
APR CLP LRG 13.4X12 ROT 20 MLT (CLIP)
APR CLP MED LRG 11.4X10 (STAPLE)
BLADE SURG SZ11 CARB STEEL (BLADE) ×3 IMPLANT
CABLE HIGH FREQUENCY MONO STRZ (ELECTRODE) ×3 IMPLANT
CHLORAPREP W/TINT 26ML (MISCELLANEOUS) ×6 IMPLANT
CLIP APPLIE ROT 10 11.4 M/L (STAPLE) IMPLANT
CLIP APPLIE ROT 13.4 12 LRG (CLIP) IMPLANT
CLIP SUT LAPRA TY ABSORB (SUTURE) ×6 IMPLANT
COVER WAND RF STERILE (DRAPES) ×3 IMPLANT
DECANTER SPIKE VIAL GLASS SM (MISCELLANEOUS) ×3 IMPLANT
DERMABOND ADVANCED (GAUZE/BANDAGES/DRESSINGS) ×2
DERMABOND ADVANCED .7 DNX12 (GAUZE/BANDAGES/DRESSINGS) ×1 IMPLANT
DEVICE SUT QUICK LOAD TK 5 (STAPLE) IMPLANT
DEVICE SUT TI-KNOT TK 5X26 (MISCELLANEOUS) IMPLANT
DEVICE SUTURE ENDOST 10MM (ENDOMECHANICALS) ×3 IMPLANT
DEVICE TI KNOT TK5 (MISCELLANEOUS)
DISSECTOR BLUNT TIP ENDO 5MM (MISCELLANEOUS) IMPLANT
DRAIN PENROSE 18X1/4 LTX STRL (WOUND CARE) ×3 IMPLANT
ELECT REM PT RETURN 15FT ADLT (MISCELLANEOUS) ×3 IMPLANT
GAUZE 4X4 16PLY RFD (DISPOSABLE) ×3 IMPLANT
GLOVE BIOGEL PI IND STRL 7.5 (GLOVE) ×1 IMPLANT
GLOVE BIOGEL PI INDICATOR 7.5 (GLOVE) ×2
GLOVE ECLIPSE 7.5 STRL STRAW (GLOVE) ×3 IMPLANT
GOWN STRL REUS W/TWL XL LVL3 (GOWN DISPOSABLE) ×6 IMPLANT
HEMOSTAT SURGICEL 4X8 (HEMOSTASIS) IMPLANT
HOVERMATT SINGLE USE (MISCELLANEOUS) ×3 IMPLANT
KIT BASIN OR (CUSTOM PROCEDURE TRAY) ×3 IMPLANT
KIT GASTRIC LAVAGE 34FR ADT (SET/KITS/TRAYS/PACK) ×3 IMPLANT
KIT TURNOVER KIT A (KITS) ×3 IMPLANT
MARKER SKIN DUAL TIP RULER LAB (MISCELLANEOUS) ×3 IMPLANT
NEEDLE SPNL 22GX3.5 QUINCKE BK (NEEDLE) ×3 IMPLANT
PACK CARDIOVASCULAR III (CUSTOM PROCEDURE TRAY) ×3 IMPLANT
QUICK LOAD TK 5 (STAPLE)
RELOAD ENDO STITCH 2.0 (ENDOMECHANICALS) ×25
RELOAD STAPLER BLUE 60MM (STAPLE) ×5 IMPLANT
RELOAD STAPLER GOLD 60MM (STAPLE) ×1 IMPLANT
RELOAD STAPLER WHITE 60MM (STAPLE) ×2 IMPLANT
SCISSORS LAP 5X45 EPIX DISP (ENDOMECHANICALS) ×3 IMPLANT
SET IRRIG TUBING LAPAROSCOPIC (IRRIGATION / IRRIGATOR) ×3 IMPLANT
SET TUBE SMOKE EVAC HIGH FLOW (TUBING) ×3 IMPLANT
SHEARS HARMONIC ACE PLUS 45CM (MISCELLANEOUS) ×3 IMPLANT
SLEEVE ADV FIXATION 12X100MM (TROCAR) ×6 IMPLANT
SOLUTION ANTI FOG 6CC (MISCELLANEOUS) ×3 IMPLANT
STAPLER ECHELON LONG 60 440 (INSTRUMENTS) IMPLANT
STAPLER RELOAD BLUE 60MM (STAPLE) ×15
STAPLER RELOAD GOLD 60MM (STAPLE) ×3
STAPLER RELOAD WHITE 60MM (STAPLE) ×6
SUT MNCRL AB 4-0 PS2 18 (SUTURE) ×3 IMPLANT
SUT RELOAD ENDO STITCH 2 48X1 (ENDOMECHANICALS) ×5
SUT RELOAD ENDO STITCH 2.0 (ENDOMECHANICALS) ×5
SUT SURGIDAC NAB ES-9 0 48 120 (SUTURE) IMPLANT
SUT VIC AB 2-0 SH 27 (SUTURE) ×3
SUT VIC AB 2-0 SH 27X BRD (SUTURE) ×1 IMPLANT
SUTURE RELOAD END STTCH 2 48X1 (ENDOMECHANICALS) ×5 IMPLANT
SUTURE RELOAD ENDO STITCH 2.0 (ENDOMECHANICALS) ×5 IMPLANT
SYR 10ML ECCENTRIC (SYRINGE) ×3 IMPLANT
SYR 20CC LL (SYRINGE) ×6 IMPLANT
SYR 50ML LL SCALE MARK (SYRINGE) ×3 IMPLANT
TIP RIGID 35CM EVICEL (HEMOSTASIS) ×3 IMPLANT
TOWEL OR 17X26 10 PK STRL BLUE (TOWEL DISPOSABLE) ×3 IMPLANT
TOWEL OR NON WOVEN STRL DISP B (DISPOSABLE) ×3 IMPLANT
TRAY FOLEY MTR SLVR 14FR STAT (SET/KITS/TRAYS/PACK) ×3 IMPLANT
TRAY FOLEY MTR SLVR 16FR STAT (SET/KITS/TRAYS/PACK) IMPLANT
TROCAR ADV FIXATION 12X100MM (TROCAR) ×3 IMPLANT
TROCAR ADV FIXATION 5X100MM (TROCAR) ×3 IMPLANT
TROCAR BLADELESS OPT 5 100 (ENDOMECHANICALS) ×3 IMPLANT
TROCAR XCEL 12X100 BLDLESS (ENDOMECHANICALS) ×3 IMPLANT
TUBE CALIBRATION LAPBAND (TUBING) IMPLANT
TUBING CONNECTING 10 (TUBING) ×4 IMPLANT
TUBING CONNECTING 10' (TUBING) ×2

## 2018-07-04 NOTE — Progress Notes (Signed)
Discussed post op day goals with patient including ambulation, IS, diet progression, pain, and nausea control.  Questions answered. 

## 2018-07-04 NOTE — Discharge Instructions (Signed)
° ° ° °GASTRIC BYPASS/SLEEVE ° Home Care Instructions ° ° These instructions are to help you care for yourself when you go home. ° °Call: If you have any problems. °• Call [phone] and ask for the surgeon on call °• If you need immediate help, come to the ER at [facility].  °• Tell the ER staff that you are a new post-op gastric bypass or gastric sleeve patient °  °Signs and symptoms to report: • Severe vomiting or nausea °o If you cannot keep down clear liquids for longer than 1 day, call your surgeon  °• Abdominal pain that does not get better after taking your pain medication °• Fever over 100.4° F with chills °• Heart beating over 100 beats a minute °• Shortness of breath at rest °• Chest pain °•  Redness, swelling, drainage, or foul odor at incision (surgical) sites °•  If your incisions open or pull apart °• Swelling or pain in calf (lower leg) °• Diarrhea (Loose bowel movements that happen often), frequent watery, uncontrolled bowel movements °• Constipation, (no bowel movements for 3 days) if this happens: Pick one °o Milk of Magnesia, 2 tablespoons by mouth, 3 times a day for 2 days if needed °o Stop taking Milk of Magnesia once you have a bowel movement °o Call your doctor if constipation continues °Or °o Miralax  (instead of Milk of Magnesia) following the label instructions °o Stop taking Miralax once you have a bowel movement °o Call your doctor if constipation continues °• Anything you think is not normal °  °Normal side effects after surgery: • Unable to sleep at night or unable to focus °• Irritability or moody °• Being tearful (crying) or depressed °These are common complaints, possibly related to your anesthesia medications that put you to sleep, stress of surgery, and change in lifestyle.  This usually goes away a few weeks after surgery.  If these feelings continue, call your primary care doctor. °  °Wound Care: You may have surgical glue, steri-strips, or staples over your incisions after  surgery °• Surgical glue:  Looks like a clear film over your incisions and will wear off a little at a time °• Steri-strips: Strips of tape over your incisions. You may notice a yellowish color on the skin under the steri-strips. This is used to make the   steri-strips stick better. Do not pull the steri-strips off - let them fall off °• Staples: Staples may be removed before you leave the hospital °o If you go home with staples, call [facility], [phone] at for an appointment with your surgeon’s nurse to have staples removed 10 days after surgery. °• Showering: You may shower two (2) days after your surgery unless your surgeon tells you differently °o Wash gently around incisions with warm soapy water, rinse well, and gently pat dry  °o No tub baths until staples are removed, steri-strips fall off or glue is gone.  °  °Medications: • Medications should be liquid or crushed if larger than the size of a dime °• Extended release pills (medication that release a little bit at a time through the day) should NOT be crushed or cut. (examples include XL, ER, DR, SR) °• Depending on the size and number of medications you take, you may need to space (take a few throughout the day)/change the time you take your medications so that you do not over-fill your pouch (smaller stomach) °• Make sure you follow-up with your primary care doctor to   make medication changes needed during rapid weight loss and life-style changes °• If you have diabetes, follow up with the doctor that orders your diabetes medication(s) within one week after surgery and check your blood sugar regularly. °• Do not drive while taking prescription pain medication  °• It is ok to take Tylenol by the bottle instructions with your pain medicine or instead of your pain medicine as needed.  DO NOT TAKE NSAIDS (EXAMPLES OF NSAIDS:  IBUPROFREN/ NAPROXEN)  °Diet:                    First 2 Weeks ° You will see the dietician t about two (2) weeks  after your surgery. The dietician will increase the types of foods you can eat if you are handling liquids well: °• If you have severe vomiting or nausea and cannot keep down clear liquids lasting longer than 1 day, call your surgeon @ ([phone]) °Protein Shake °• Drink at least 2 ounces of shake 5-6 times per day °• Each serving of protein shakes (usually 8 - 12 ounces) should have: °o 15 grams of protein  °o And no more than 5 grams of carbohydrate  °• Goal for protein each day: °o Men = 80 grams per day °o Women = 60 grams per day °• Protein powder may be added to fluids such as non-fat milk or Lactaid milk or unsweetened Soy/Almond milk (limit to 35 grams added protein powder per serving) ° °Hydration °• Slowly increase the amount of water and other clear liquids as tolerated (See Acceptable Fluids) °• Slowly increase the amount of protein shake as tolerated  °•  Sip fluids slowly and throughout the day.  Do not use straws. °• May use sugar substitutes in small amounts (no more than 6 - 8 packets per day; i.e. Splenda) ° °Fluid Goal °• The first goal is to drink at least 8 ounces of protein shake/drink per day (or as directed by the nutritionist); some examples of protein shakes are Syntrax Nectar, Adkins Advantage, EAS Edge HP, and Unjury. See handout from pre-op Bariatric Education Class: °o Slowly increase the amount of protein shake you drink as tolerated °o You may find it easier to slowly sip shakes throughout the day °o It is important to get your proteins in first °• Your fluid goal is to drink 64 - 100 ounces of fluid daily °o It may take a few weeks to build up to this °• 32 oz (or more) should be clear liquids  °And  °• 32 oz (or more) should be full liquids (see below for examples) °• Liquids should not contain sugar, caffeine, or carbonation ° °Clear Liquids: °• Water or Sugar-free flavored water (i.e. Fruit H2O, Propel) °• Decaffeinated coffee or tea (sugar-free) °• Crystal Lite, Wyler’s Lite,  Minute Maid Lite °• Sugar-free Jell-O °• Bouillon or broth °• Sugar-free Popsicle:   *Less than 20 calories each; Limit 1 per day ° °Full Liquids: °Protein Shakes/Drinks + 2 choices per day of other full liquids °• Full liquids must be: °o No More Than 15 grams of Carbs per serving  °o No More Than 3 grams of Fat per serving °• Strained low-fat cream soup (except Cream of Potato or Tomato) °• Non-Fat milk °• Fat-free Lactaid Milk °• Unsweetened Soy Or Unsweetened Almond Milk °• Low Sugar yogurt (Dannon Lite & Fit, Greek yogurt; Oikos Triple Zero; Chobani Simply 100; Yoplait 100 calorie Greek - No Fruit on the Bottom) ° °  °Vitamins   and Minerals • Start 1 day after surgery unless otherwise directed by your surgeon °• 2 Chewable Bariatric Specific Multivitamin / Multimineral Supplement with iron (Example: Bariatric Advantage Multi EA) °• Chewable Calcium with Vitamin D-3 °(Example: 3 Chewable Calcium Plus 600 with Vitamin D-3) °o Take 500 mg three (3) times a day for a total of 1500 mg each day °o Do not take all 3 doses of calcium at one time as it may cause constipation, and you can only absorb 500 mg  at a time  °o Do not mix multivitamins containing iron with calcium supplements; take 2 hours apart °• Menstruating women and those with a history of anemia (a blood disease that causes weakness) may need extra iron °o Talk with your doctor to see if you need more iron °• Do not stop taking or change any vitamins or minerals until you talk to your dietitian or surgeon °• Your Dietitian and/or surgeon must approve all vitamin and mineral supplements °  °Activity and Exercise: Limit your physical activity as instructed by your doctor.  It is important to continue walking at home.  During this time, use these guidelines: °• Do not lift anything greater than ten (10) pounds for at least two (2) weeks °• Do not go back to work or drive until your surgeon says you can °• You may have sex when you feel comfortable  °o It is  VERY important for female patients to use a reliable birth control method; fertility often increases after surgery  °o All hormonal birth control will be ineffective for 30 days after surgery due to medications given during surgery a barrier method must be used. °o Do not get pregnant for at least 18 months °• Start exercising as soon as your doctor tells you that you can °o Make sure your doctor approves any physical activity °• Start with a simple walking program °• Walk 5-15 minutes each day, 7 days per week.  °• Slowly increase until you are walking 30-45 minutes per day °Consider joining our BELT program. [phone] or email [email or web address] °  °Special Instructions Things to remember: °• Use your CPAP when sleeping if this applies to you ° °• [facility] has two free Bariatric Surgery Support Groups that meet monthly °o The 3rd Thursday of each month, 6 pm, [facility]  °o The 2nd Friday of each month, 11:45 am in the private dining room in the basement of [place] °• It is very important to keep all follow up appointments with your surgeon, dietitian, primary care physician, and behavioral health practitioner °• Routine follow up schedule with your surgeon include appointments at 2-3 weeks, 6-8 weeks, 6 months, and 1 year at a minimum.  Your surgeon may request to see you more often.   °o After the first year, please follow up with your bariatric surgeon and dietitian at least once a year in order to maintain best weight loss results °[facility]: [phone] °[facility]: [phone] °Bariatric Nurse Coordinator: [phone] °  °   Reviewed and Endorsed  °by [place] Patient Education Committee, June, 2016 °Edits Approved: Aug, 2018 ° ° ° °

## 2018-07-04 NOTE — Anesthesia Postprocedure Evaluation (Signed)
Anesthesia Post Note  Patient: Kora Lozano  Procedure(s) Performed: LAPAROSCOPIC ROUX-EN-Y GASTRIC BYPASS WITH UPPER ENDOSCOPY ERAS Pathway (N/A Abdomen)     Patient location during evaluation: PACU Anesthesia Type: General Level of consciousness: sedated Pain management: pain level controlled Vital Signs Assessment: post-procedure vital signs reviewed and stable Respiratory status: spontaneous breathing and respiratory function stable Cardiovascular status: stable Postop Assessment: no apparent nausea or vomiting Anesthetic complications: no    Last Vitals:  Vitals:   07/04/18 1115 07/04/18 1130  BP: 118/61 119/73  Pulse: 75 77  Resp: (!) 25 (!) 22  Temp:  36.6 C  SpO2: 100% 96%    Last Pain:  Vitals:   07/04/18 1130  TempSrc:   PainSc: Asleep                 Crystall Donaldson Heuer

## 2018-07-04 NOTE — Op Note (Signed)
Preop diagnosis: Morbid obesity  Postop diagnosis: Morbid obesity  Body mass index is 44.17 kg/m.  Surgical procedure: Laparoscopic Roux-en-Y gastric bypass  Surgeon: Sharlet Salina T.Yoltzin Barg M.D.  Asst.: Feliciana Rossetti M.D.  Anesthesia: General  Complications:  None  EBL: Minimal  Drains: None  Disposition: PACU in good condition  Description of procedure: Patient is brought to the operating room and general anesthesia induced. She had received preoperative broad-spectrum IV antibiotics and subcutaneous heparin. The abdomen was widely sterilely prepped and draped. Patient timeout was performed and correct patient and procedure confirmed. Access was obtained with a 12 mm Optiview trocar in the left upper quadrant and pneumoperitoneum established without difficulty. Under direct vision 12 mm trocars were placed laterally in the right upper quadrant, right upper quadrant midclavicular line, and to the left and above the umbilicus for the camera port. A 5 mm trocar was placed laterally in the left upper quadrant. A TAP block was performed with dilute Exparel.  The omentum was brought into the upper abdomen and the transverse mesocolon elevated and the ligament of Treitz clearly identified. A 40 cm biliopancreatic limb was then carefully measured from the ligament of Treitz. The small intestine was divided at this point with a single firing of the white load linear stapler. A Penrose drain was sutured to the end of the Roux-en-Y limb for later identification. A 100 cm Roux-en-Y limb was then carefully measured. At this point a side-to-side anastomosis was created between the Roux limb and the end of the biliopancreatic limb. This was accomplished with a single firing of the 60 mm white load linear stapler. The common enterotomy was closed with a running 2-0 Vicryl begun at either end of the enterotomy and tied centrally. The mesenteric defect was then closed with running 2-0 silk. The omentum was then  divided with the harmonic scalpel up towards the transverse colon to allow mobility of the Roux limb toward the gastric pouch. The patient was then placed in steep reversed Trendelenburg. Through a 5 mm subxiphoid site the [facility] retractor was placed and the left lobe of the liver elevated with excellent exposure of the upper stomach and hiatus. The angle of Hiss was then mobilized with the harmonic scalpel. A 4 cm gastric pouch was then carefully measured along the lesser curve of the stomach. Dissection was carried along the lesser curve at this point with the Harmonic scalpel working carefully back toward the lesser sac at right angles to the lesser curve. The free lesser sac was then entered. After being sure all tubes were removed from the stomach an initial firing of the gold load 60 mm linear stapler was fired at right angles across the lesser curve for about 4 cm. The gastric pouch was further mobilized posteriorly and then the pouch was completed with 3 further firings of the 60 mm blue load linear stapler up through the previously dissected angle of His. It was ensured that the pouch was completely mobilized away from the gastric remnant. This created a nice tubular 4-5 cm gastric pouch. The staple line of the gastric remnant was then oversewn with 2-0 silk for hemostasis. The Roux limb was then brought up in an antecolic fashion with the candycane facing to the patient's left without undue tension. The gastrojejunostomy was created with an initial posterior row of 2-0 Vicryl between the Roux limb and the staple line of the gastric pouch. Enterotomies were then made in the gastric pouch and the Roux limb with the harmonic scalpel and at  approximately 2-2-1/2 cm anastomosis was created with a single firing of the blue load linear stapler. The staple line was inspected and was intact without bleeding. The common enterotomy was then closed with running 2-0 Vicryl begun at either end and tied centrally. The  wall tube was then easily passed through the anastomosis and an outer anterior layer of running 2-0 Vicryl was placed. The Ewald tube was removed. With the outlet of the gastrojejunostomy clamped and under saline irrigation the assistant performed upper endoscopy and with the gastric pouch tensely distended with air there was no evidence of leak. The pouch was desufflated. The Vonita Moss defect was closed with running 2-0 silk. The abdomen was inspected for any evidence of bleeding or bowel injury and everything looked fine. The Nathanson retractor was removed under direct vision after coating the anastomosis with Tisseel tissue sealant. All CO2 was evacuated and trochars removed. Skin incisions were closed with staples. Sponge needle and instrument counts were correct. The patient was taken to the PACU in good condition.     Mariella Saa MD, FACS  07/04/2018, 10:44 AM

## 2018-07-04 NOTE — Progress Notes (Signed)
PHARMACY CONSULT FOR:  Risk Assessment for Post-Discharge VTE Following Bariatric Surgery  Post-Discharge VTE Risk Assessment: This patient's probability of 30-day post-discharge VTE is increased due to the factors marked:   Female    Age >/=60 years    BMI >/=50 kg/m2    CHF    Dyspnea at Rest    Paraplegia   x Non-gastric-band surgery    Operation Time >/=3 hr    Return to OR     Length of Stay >/= 3 d   Predicted probability of 30-day post-discharge VTE: 0.16%  Other patient-specific factors to consider:   Recommendation for Discharge: No pharmacologic prophylaxis post-discharge   Sydney Dominguez is a 28 y.o. female who underwent Laparoscopic Roux-en-Y gastric bypass on 07/04/2018   Case start: 0814 Case end: 1032   Allergies  Allergen Reactions  . Blood-Group Specific Substance     Patient Measurements: Height: 5\' 3"  (160 cm) Weight: 249 lb 6 oz (113.1 kg) IBW/kg (Calculated) : 52.4 Body mass index is 44.17 kg/m.  No results for input(s): WBC, HGB, HCT, PLT, APTT, CREATININE, LABCREA, CREATININE, CREAT24HRUR, MG, PHOS, ALBUMIN, PROT, ALBUMIN, AST, ALT, ALKPHOS, BILITOT, BILIDIR, IBILI in the last 72 hours. Estimated Creatinine Clearance: 126.8 mL/min (by C-G formula based on SCr of 0.66 mg/dL).    Past Medical History:  Diagnosis Date  . Anemia   . Anxiety   . Asthma   . B12 deficiency   . Back pain   . Bipolar disorder (HCC)   . Chest pain   . Constipation   . Depression   . Dyspnea   . GERD (gastroesophageal reflux disease)   . Palpitations   . Prediabetes   . Schizophrenia (HCC)   . Vertigo   . Vitamin D deficiency      Arley Phenix RPh 07/04/2018, 11:47 AM Pager [phone]

## 2018-07-04 NOTE — Op Note (Signed)
Preoperative diagnosis: Roux-en-Y gastric bypass  Postoperative diagnosis: Same   Procedure: Upper endoscopy   Surgeon: Feliciana Rossetti, M.D.  Anesthesia: Gen.   Indications for procedure: This patient was undergoing a Roux-en-Y gastric bypass.   Description of procedure: The endoscopy was placed in the mouth and into the oropharynx and under endoscopic vision it was advanced to the esophagogastric junction. The pouch was insufflated and no bleeding or bubbles were seen. The GEJ was identified at 39cm from the teeth. The anastomosis was patent and seen at 44cm. No bleeding or leaks were detected. The scope was withdrawn without difficulty.   Feliciana Rossetti, M.D. General, Bariatric, & Minimally Invasive Surgery [facility]

## 2018-07-04 NOTE — Transfer of Care (Signed)
Immediate Anesthesia Transfer of Care Note  Patient: Sydney Dominguez  Procedure(s) Performed: Procedure(s): LAPAROSCOPIC ROUX-EN-Y GASTRIC BYPASS WITH UPPER ENDOSCOPY ERAS Pathway (N/A)  Patient Location: PACU  Anesthesia Type:General  Level of Consciousness:  sedated, patient cooperative and responds to stimulation  Airway & Oxygen Therapy:Patient Spontanous Breathing and Patient connected to face mask oxgen  Post-op Assessment:  Report given to PACU RN and Post -op Vital signs reviewed and stable  Post vital signs:  Reviewed and stable  Last Vitals:  Vitals:   07/04/18 0515  BP: 138/81  Pulse: 100  Resp: 18  Temp: (!) 36.4 C  SpO2: 99%    Complications: No apparent anesthesia complications

## 2018-07-04 NOTE — H&P (Signed)
istory of Present Illness Sydney Salina T. Owin Vignola MD; 06/23/2018 11:38 AM) The patient is a 28 year old female who presents with obesity. She returns for her preoperative visit prior to planned Roux-en-Y gastric bypass for morbid obesity. The patient gives a history of progressive obesity since adolescence despite multiple attempts at medical management. She has been able to lose a maximum of about 10-15 pounds at a time and then experiences progressive weight regain. She currently is working with Dr. Dalbert Garnet at the medical weight loss clinic and has been able to lose about 11 pounds. Obesity has been affecting the patient in a number of ways including difficulties with routine daily activities and work. 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 She is single.. Significant co-morbid illnesses have developed including prediabetes and elevated cholesterol and she is very concerned about her long-term health. She successfully completed her preoperative workup. No concerns on psychological nutrition evaluation. We reviewed her lab work which showed mildly elevated LDL but otherwise chemistries, ultrasound, chest x-ray all within normal limits.   Problem List/Past Medical Sydney Salina T. Sandford Diop, MD; 06/23/2018 11:38 AM) HYPERLIPIDEMIA, MILD (E78.5)  PREDIABETES (R73.03)  MORBID OBESITY, UNSPECIFIED OBESITY TYPE (E66.01)  URINARY, INCONTINENCE, STRESS FEMALE (N39.3)   Diagnostic Studies History Sydney Salina T. Zakiyah Diop, MD; 06/23/2018 11:38 AM) Colonoscopy  never Mammogram  never Pap Smear  1-5 years ago  Allergies Judithann Sauger, RMA; 06/23/2018 11:19 AM) No Known Drug Allergies [03/02/2018]: Allergies Reconciled   Medication History Judithann Sauger, [place]; 06/23/2018 11:19 AM) Vitamin D (Ergocalciferol) (50000UNIT Capsule, Oral) Active. metFORMIN HCl (500MG  Tablet, Oral) Active. lamoTRIgine (200MG  Tablet, Oral) Active. Medications Reconciled  Social  History Sydney Salina T. Bryanda Mikel, MD; 06/23/2018 11:38 AM) Caffeine use  Coffee, Tea. No alcohol use  No drug use  Tobacco use  Never smoker.  Family History Sydney Salina T. Jarmarcus Wambold, MD; 06/23/2018 11:38 AM) Alcohol Abuse  Father. Arthritis  Mother. Depression  Father, Mother. Hypertension  Father.  Pregnancy / Birth History Sydney Salina T. Kery Batzel, MD; 06/23/2018 11:38 AM) Age at menarche  8 years. Gravida  1 Length (months) of breastfeeding  >24 Maternal age  23-25 Para  1 Regular periods   Other Problems Sydney Salina T. Meagan Spease, MD; 06/23/2018 11:38 AM) Anxiety Disorder  Back Pain  Chest pain  Depression  Gastroesophageal Reflux Disease   Vitals Judithann Sauger RMA; 06/23/2018 11:19 AM) 06/23/2018 11:18 AM Weight: 258.31 lb Height: 63in Body Surface Area: 2.16 m Body Mass Index: 45.76 kg/m  Temp.: 97.73F  Pulse: 97 (Regular)  BP: 130/80 (Sitting, Left Arm, Standard)       Physical Exam Sydney Salina T. Reco Shonk MD; 06/23/2018 11:39 AM) The physical exam findings are as follows: Note:General: Alert, morbidly obese abdomen American female, in no distress Skin: Warm and dry without rash or infection. HEENT: No palpable masses or thyromegaly. Sclera nonicteric. Pupils equal round and reactive. Lymph nodes: No cervical, supraclavicular, nodes palpable. Lungs: Breath sounds clear and equal. No wheezing or increased work of breathing. Cardiovascular: Regular rate and rhythm without murmer. No JVD or edema. Abdomen: Nondistended. Soft and nontender. No masses palpable. No organomegaly. No palpable hernias. Extremities: No edema or joint swelling or deformity. No chronic venous stasis changes. Neurologic: Alert and fully oriented. Gait normal. No focal weakness. Psychiatric: Normal mood and affect. Thought content appropriate with normal judgement and insight    Assessment & Plan Sydney Salina T. Deyna Carbon MD; 06/23/2018 11:45 AM) OBESITY, MORBID, BMI 40.0-49.9  (E66.01) Impression: Patient with progressive morbid obesity unresponsive  to multiple efforts at medical management who presents with a BMI of 43.6 and comorbidities of prediabetes, dyslipidemia and stress urinary incontinence. I believe there would be very significant medical benefit from surgical weight loss. After our discussion of surgical options currently available the patient has decided to proceed with laparoscopic Roux-en-Y gastric bypass due to concern about reflux which has been a problem for her in the past. We have discussed the nature of the problem and the risks of remaining morbidly obese. We discussed laparoscopic Roux-en-Y gastric bypass in detail including the nature of the procedure, expected hospitalization and recovery, and major risks of anesthetic complications, bleeding, blood clots and pulmonary emboli, leakage and infection and long-term risks of stricture, ulceration, bowel obstruction, nutritional deficiencies, and failure to lose weight or weight regain. We discussed these problems could lead to death. The patient was given a complete consent form to review and all questions were answered. Successfully completed preoperative evaluation without concerns identified. All her questions were answered and she is ready to proceed with surgery.

## 2018-07-04 NOTE — Interval H&P Note (Signed)
History and Physical Interval Note:  07/04/2018 7:24 AM  Sydney Dominguez  has presented today for surgery, with the diagnosis of Morbid Obesity, Pre-Diabetes, Urinary Incontinence, Hyperlipidemia, GERD.  The various methods of treatment have been discussed with the patient and family. After consideration of risks, benefits and other options for treatment, the patient has consented to  Procedure(s): LAPAROSCOPIC ROUX-EN-Y GASTRIC BYPASS WITH UPPER ENDOSCOPY, UPPER ENDO, ERAS Pathway (N/A) as a surgical intervention.  The patient's history has been reviewed, patient examined, no change in status, stable for surgery.  I have reviewed the patient's chart and labs.  Questions were answered to the patient's satisfaction.     Lorne Skeens [name]

## 2018-07-04 NOTE — Anesthesia Procedure Notes (Signed)
Procedure Name: Intubation Date/Time: 07/04/2018 7:56 AM Performed by: Lavina Hamman, CRNA Pre-anesthesia Checklist: Patient identified, Emergency Drugs available, Suction available, Patient being monitored and Timeout performed Patient Re-evaluated:Patient Re-evaluated prior to induction Oxygen Delivery Method: Circle system utilized Preoxygenation: Pre-oxygenation with 100% oxygen Induction Type: IV induction Ventilation: Mask ventilation without difficulty Laryngoscope Size: Mac and 4 Grade View: Grade II Tube type: Oral Tube size: 7.0 mm Number of attempts: 1 Airway Equipment and Method: Stylet Placement Confirmation: ETT inserted through vocal cords under direct vision,  positive ETCO2,  CO2 detector and breath sounds checked- equal and bilateral Secured at: 22 cm Tube secured with: Tape Dental Injury: Teeth and Oropharynx as per pre-operative assessment  Comments: ATOI

## 2018-07-05 ENCOUNTER — Encounter (HOSPITAL_COMMUNITY): Payer: Self-pay | Admitting: General Surgery

## 2018-07-05 LAB — CBC WITH DIFFERENTIAL/PLATELET
Abs Immature Granulocytes: 0.04 10*3/uL (ref 0.00–0.07)
Basophils Absolute: 0 10*3/uL (ref 0.0–0.1)
Basophils Relative: 0 %
Eosinophils Absolute: 0 10*3/uL (ref 0.0–0.5)
Eosinophils Relative: 0 %
HEMATOCRIT: 40.8 % (ref 36.0–46.0)
HEMOGLOBIN: 11.6 g/dL — AB (ref 12.0–15.0)
Immature Granulocytes: 0 %
Lymphocytes Relative: 10 %
Lymphs Abs: 1 10*3/uL (ref 0.7–4.0)
MCH: 24.6 pg — ABNORMAL LOW (ref 26.0–34.0)
MCHC: 28.4 g/dL — ABNORMAL LOW (ref 30.0–36.0)
MCV: 86.4 fL (ref 80.0–100.0)
Monocytes Absolute: 0.9 10*3/uL (ref 0.1–1.0)
Monocytes Relative: 9 %
Neutro Abs: 7.8 10*3/uL — ABNORMAL HIGH (ref 1.7–7.7)
Neutrophils Relative %: 81 %
Platelets: 377 10*3/uL (ref 150–400)
RBC: 4.72 MIL/uL (ref 3.87–5.11)
RDW: 20.6 % — ABNORMAL HIGH (ref 11.5–15.5)
WBC: 9.7 10*3/uL (ref 4.0–10.5)
nRBC: 0 % (ref 0.0–0.2)

## 2018-07-05 MED ORDER — OXYCODONE HCL 5 MG/5ML PO SOLN: 5.0000 mg | ORAL | 0 refills | Status: DC | PRN

## 2018-07-05 NOTE — Plan of Care (Signed)

## 2018-07-05 NOTE — Plan of Care (Signed)
Reviewed discharge instructions with patient; copy given. IV removed. Patient ready for discharge.  

## 2018-07-05 NOTE — Discharge Summary (Signed)
  Patient ID: Sydney Dominguez [id number] 27 y.o. May 04, 1990  07/04/2018  Discharge date and time: 07/05/2018   Admitting Physician: Mariella Saa  Discharge Physician: Mariella Saa  Admission Diagnoses: Morbid Obesity, Pre-Diabetes, Urinary Incontinence, Hyperlipidemia, GERD  Discharge Diagnoses: Same  Operations: Procedure(s): LAPAROSCOPIC ROUX-EN-Y GASTRIC BYPASS WITH UPPER ENDOSCOPY ERAS Pathway  Admission Condition: good  Discharged Condition: good  Indication for Admission: Patient with progressive morbid obesity unresponsive to multiple efforts at medical management who presents with a BMI of 43.6 and comorbidities of prediabetes, dyslipidemia and stress urinary incontinence.  After extensive preoperative work-up and discussion detailed elsewhere she is electively admitted for laparoscopic Roux-en-Y gastric bypass.  Hospital Course: On the day of admission the patient underwent an uneventful laparoscopic Roux-en-Y gastric bypass.  Her postoperative course was uncomplicated.  The following morning her CBC is normal.  Vital signs normal.  She has only mild discomfort.  Denies nausea and tolerating fluids.  Abdomen is benign.  Wounds are clean and dry.  She is felt ready for discharge.  Disposition: Home  Patient Instructions:  Allergies as of 07/05/2018      Reactions   Blood-group Specific Substance       Medication List    TAKE these medications   APPLE CIDER VINEGAR PO Take 10 mLs by mouth 2 (two) times daily.   BARIATRIC MULTIVITAMINS/IRON PO Take 1 tablet by mouth daily.   COLLAGEN PO Take 1 tablet by mouth daily.   Fish Oil 500 MG Caps Take 1 capsule by mouth daily.   lamoTRIgine 200 MG tablet Commonly known as:  LaMICtal Take 1 tablet (200 mg total) by mouth daily.   metFORMIN 500 MG tablet Commonly known as:  GLUCOPHAGE Take 1 tablet (500 mg total) by mouth daily with breakfast. Notes to patient:  Monitor Blood Sugar Frequently and keep a  log for primary care physician, you may need to adjust medication dosage with rapid weight loss.     oxyCODONE 5 MG/5ML solution Commonly known as:  ROXICODONE Take 5 mLs (5 mg total) by mouth every 4 (four) hours as needed for moderate pain or severe pain.   Spirulina Powd Take 10 mLs by mouth daily. Mix in 8oz. of water   Vitamin D (Ergocalciferol) 1.25 MG (50000 UT) Caps capsule Commonly known as:  DRISDOL Take 1 capsule (50,000 Units total) by mouth every 7 (seven) days.       Activity: activity as tolerated Diet: Bariatric protein shakes Wound Care: none needed  Follow-up:  With Dr. Johna Sheriff in 3 weeks.  Signed: Mariella Saa MD, FACS  07/05/2018, 9:06 AM

## 2018-07-05 NOTE — Progress Notes (Signed)
Patient alert and oriented, pain is controlled. Patient is tolerating fluids, advanced to protein shake today, patient is tolerating well. Reviewed Gastric Bypass discharge instructions with patient and patient is able to articulate understanding. Provided information on BELT program, Support Group and [facility] outpatient pharmacy. All questions answered, will continue to monitor.   Total 24 fluid intake 540 Call back per dehydration protocol Friday 07/07/2018

## 2018-07-05 NOTE — Plan of Care (Signed)
Patient sitting up in bed this morning; has completed water intake and now working on protein intake. No complaints of pain currently. Has ambulated in room and hallway. Using IS. Will continue to monitor.

## 2018-07-06 ENCOUNTER — Other Ambulatory Visit: Payer: Self-pay | Admitting: *Deleted

## 2018-07-06 NOTE — Patient Outreach (Signed)
[facility]) Care Management  07/06/2018  Sydney Dominguez January 30, 1991 [id number]    Transition of care call   Referral received: 07/06/2018 Initial outreach: 07/06/2018 Insurance: [employer]   Subjective: Initial successful telephone call to patient's preferred number in order to complete transition of care assessment; 2 HIPAA identifiers verified. Explained purpose of call and completed transition of care assessment.  States she is doing well, denies post op problems, states surgical pain well managed with prescribed medications, tolerating  diet , denies bladder problems and reports delayed bowels since Tuesday 07/04/2018. States she will be taking OTC Miralax to assist with this delay. Member is aware to contact her provider if delay more then a few days if unsuccessful with OTC medication.  Mother and brother are assisting with her recovery.      Objective:  [name] was hospitalized at [facility] from 07/04/2018-07/05/2018 for s/p Lap Roux-en-Y gastric bypass.  Comorbidities include: (Severe) Obesity due to excess calories, Hyperlipidemia, urge incontinence She was discharged to home on 07/05/2018 without the need for home health services or DME.   Assessment:  Patient voices good understanding of all discharge instructions.  See transition of care flowsheet for assessment details.   Plan:  Reviewed [place] [employer] Management 2020 Wellness Requirements of: Completing the computerized Health Assessment and the Health Action Step with Active Health Management [facility]) by December 19 2018 AND have an annual physical between April 19, 2017 and October 18, 2018.  No ongoing care management needs identified so will close case to [facility] Care Management care management services and route successful outreach letter with [facility] Care Management pamphlet and 24 Hour Nurse Line Magnet to [employer] Care  Management clinical pool to be mailed to patient's home address.   Elliot Cousin, RN Care Management Coordinator [facility] Main Office [phone]

## 2018-07-07 ENCOUNTER — Telehealth (HOSPITAL_COMMUNITY): Payer: Self-pay

## 2018-07-07 NOTE — Telephone Encounter (Signed)
Patient called to discuss post bariatric surgery follow up questions.  See below:   1.  Tell me about your pain and pain management? Was having pain called on call physician, instructed to take 10 mg instead of 5 mg of oxy  2.  Let's talk about fluid intake.  How much total fluid are you taking in?36 ounces  3.  How much protein have you taken in the last 2 days?45 grams protein  4.  Have you had nausea?  Tell me about when have experienced nausea and what you did to help?zofran in morning,  5.  Has the frequency or color changed with your urine?lightens up during day  6.  Tell me what your incisions look like?no problems, bandaid over one incision  7.  Have you been passing gas? BM?no bm since discharge took some miralax  8.  If a problem or question were to arise who would you call?  Do you know contact numbers for [facility], [facility], and [facility]?knows how to contact all services  9.  How has the walking going?walking every day 10-20 minutes and walking every hour  10.  How are your vitamins and calcium going?  How are you taking them? Started mvi and calcium, taking appropriately

## 2018-07-18 ENCOUNTER — Ambulatory Visit: Payer: Self-pay

## 2018-07-19 ENCOUNTER — Other Ambulatory Visit: Payer: Self-pay

## 2018-07-19 ENCOUNTER — Encounter: Payer: No Typology Code available for payment source | Attending: General Surgery | Admitting: Skilled Nursing Facility1

## 2018-07-19 DIAGNOSIS — Z6841 Body Mass Index (BMI) 40.0 and over, adult: Secondary | ICD-10-CM

## 2018-07-19 NOTE — Progress Notes (Signed)
Post-Op appt  Surgery date: 07/04/2018 Surgery type: RYGB Start weight at [facility]: 218.3 Weight today: 235.2   Pt states she has been struggling with constipation using milk of magnesia and miralax having a bowel movement 152 times a week. Pt states she has been drinking water, protein shakes, chicken broth, and sugar free popcicles: totaling about 48-50 oz. Pt states she is taking 2 celebrate for her multivitamin. Pt states she is taking 3 calcium a day. Pt states she takes nausea medication. Pt states she has not tried any solid foods yet.   Body Composition Scale 07/19/2018  Total Body Fat % 43.7  Visceral Fat 11  Fat-Free Mass % 56.2   Total Body Water % 42.6   Muscle-Mass lbs 31.7  Body Fat Displacement          Torso  lbs 63.7         Left Leg  lbs 12.7         Right Leg  lbs 12.7         Left Arm  lbs 6.3         Right Arm   lbs 6.3    The following the learning objectives were met by the patient during this course:  Identifies Phase 3A (Soft, High Proteins) Dietary Goals and will begin from 2 weeks post-operatively to 2 months post-operatively  Identifies appropriate sources of fluids and proteins   States protein recommendations and appropriate sources post-operatively  Identifies the need for appropriate texture modifications, mastication, and bite sizes when consuming solids  Identifies appropriate multivitamin and calcium sources post-operatively  Describes the need for physical activity post-operatively and will follow MD recommendations  States when to call healthcare provider regarding medication questions or post-operative complications  Handouts given during class include:  Phase 3A: Soft, High Protein Diet Handout  Follow-Up Plan: Patient will follow-up at [facility] in 6 weeks for 2 month post-op nutrition visit for diet advancement per MD.

## 2018-08-28 MED FILL — ONDANSETRON ODT 4 MG TABLET: 4 | 4 days supply | Qty: 15 | Fill #0

## 2018-08-30 ENCOUNTER — Ambulatory Visit: Payer: Self-pay | Admitting: Skilled Nursing Facility1

## 2018-09-20 ENCOUNTER — Other Ambulatory Visit: Payer: Self-pay

## 2018-09-20 ENCOUNTER — Encounter: Payer: No Typology Code available for payment source | Attending: General Surgery | Admitting: Skilled Nursing Facility1

## 2018-09-20 DIAGNOSIS — Z6841 Body Mass Index (BMI) 40.0 and over, adult: Secondary | ICD-10-CM

## 2018-09-20 NOTE — Progress Notes (Signed)
Bariatric Follow-Up Visit 2 Months Medical Nutrition Therapy  Appt Start Time: 10:15 End Time: 11:50  connected withpton the phoneat 10:15 AM/PM EDTby telephoneat home and verified that I am speaking with the correct person using two identifiers.  I discussed the limitations, risks, security and privacy concerns of performing an evaluation and management service by telephone and the availability of in person appointments. I also discussed with the patient that there may be a patient responsible charge related to this service. The patient expressed understanding and agreed to proceed. Primary Concerns Today: Bariatric Surgery Nutrition Follow Up   Bariatric Surgery Type: RYGB   Surgery Date: 07/04/2018  NUTRITION ASSESSMENT   Anthropometrics  Start weight at [facility]: 218.3  lbs  Today's weight: phone appt  lbs Weight change:  phone appt lbs (since previous nutrition appointment)   Psychosocial/Lifestyle Dx: bipolar, prediabetes   Pt was distracted on the phone.  Pt states she spoke with the PA about after she eats even after chewing well it goes down hard with difficulty swallowing so sticking with fish and [place] still feeling like it is getting stuck also stating it has not been doing it lately also stating this was keeping her from eating due to pain: Dietitian clarified if her foods were moist: pt states she adds gravy or sauce with tomato sauce. Pt states she takes zofran daily due to nauseous being brought on by smells or when eating: no emesis.  Pt states she has been taking miralax daily. Last bowel movement 3 days ago being diarrhea: 4 lose stools. Since about 2 weeks ago constipation/diarrhea.  Pt states she has been taking 2 procare health a day but that serving is 1 a day. Pt states difficulty in swallowing and nausea comes on usually in the afternoon during lunch after 3rd or 4th bite not occurring with soup. Pt states she is not taking an acid reducer. Pt states she does  regurgitate food. Pt states her lunches do feel rushed so she eats fairly fast.  Pt states on days she cannot eat she drinks protein shakes.   Medications:  Labs:  Supplements:  2 procare a day and 3 calcium  24-Hr Dietary Recall: protein shake a few times a week: premier; 2 sugar free popcicle a day; fruit every day 2-3 times a day in the last week First Meal: 7am: procare and then yogurt Snack: cottage cheese  Second Meal: chicken vegetable soup hot from [employer] sometimes with crackers Snack: yogurt bar and pineapple Third Meal: fish and broccoli or green beans or asparagus or [place]key chili soup from [employer] hot bar Snack: sugar free popcicle  Beverages: lemonade, diluted orange juice, water with crystal light   Estimated Daily Fluid Intake: 48-50 oz Estimated Daily Protein Intake: 50-60 g   Physical Activity  Current average weekly physical activity: 3-4 miles walking 3 days a week and stationary bike 7 days a week for 30-45 minutes    Signs/Symptoms  Using straws: no Drinking while eating: no Chewing/swallowing difficulties: yes Changes in vision: no Changes to mood/headaches: no Hair loss/changes to skin/nails: no Difficulty focusing/concentrating: no Sweating: no Dizziness/lightheadedness: no Palpitations: no Carbonated/caffeinated beverages: no N/V/D/C/Gas: nausea, constipation, diarrhea  Abdominal pain: no Dumping syndrome: no     NUTRITION DIAGNOSIS  Overweight/obesity ([place]-3.3) related to past poor dietary habits and physical inactivity as evidenced by patient w/ completed Bariatric surgery following dietary guidelines for continued weight loss and healthy nutrition status.     NUTRITION INTERVENTION Nutrition counseling (C-1) and  education (E-2) to facilitate bariatric surgery goals, including:  Diet advancement to the next phase now including non-starchy vegetables  The importance of consuming adequate calories as well as certain nutrients  daily due to the body's need for essential vitamins, minerals, and fats  The importance of daily physical activity and to reach a goal of at least 150 minutes of moderate to vigorous physical activity weekly (or as directed by their physician) due to benefits such as increased musculature and improved lab values  Pt Chosen Goals: -Only take 1 multivitamin (procare health) a day -Try alkaline water; pH 8.8 -Do not eat tomato soup or sauce  -Rely more on vegetarian and seafood proteins  -Stop drinking the orange juice due to acid -Start logging your food and drink for the next week -Avoid pineapple, orange, lemon, lime -Limit yourself to 2 times a day of fruit; you only want 2-3 snacks a day -Take at least 30 minutes to eat don't feel like you have to finish it all; chew until applesauce consistency   Handouts Provided Include  Sent email with summary of goals   Readiness for Change: Contemplating   Demonstrated degree of understanding via: Teach Back     MONITORING & EVALUATION Dietary intake, weekly physical activity, and body weight follow up: will call pt to check in on progress next week

## 2018-09-20 NOTE — Patient Instructions (Addendum)
-  Only take 1 multivitamin (procare health) a day  -Try alkaline water; pH 8.8  -Do not eat tomato soup or sauce   -Rely more on vegetarian and seafood proteins   -Stop drinking the orange juice due to acid  -Start logging your food and drink for the next week  -Avoid pineapple, orange, lemon, lime  -Limit yourself to 2 times a day of fruit; you only want 2-3 snacks a day  -Take at least 30 minutes to eat don't feel like you have to finish it all; chew until applesauce consistency

## 2018-10-03 ENCOUNTER — Telehealth: Payer: Self-pay | Admitting: Skilled Nursing Facility1

## 2018-10-03 NOTE — Telephone Encounter (Signed)
Called pt to follow up on her status.

## 2018-12-17 IMAGING — CR DG CHEST 2V
2 series · 2 of 2 positions shown · non-contrast
Comparison: 09/20/2012

CLINICAL DATA: Preop for bariatric surgery. Gastroesophageal reflux
disease.

EXAM:
CHEST - 2 VIEW

[w chest pa]
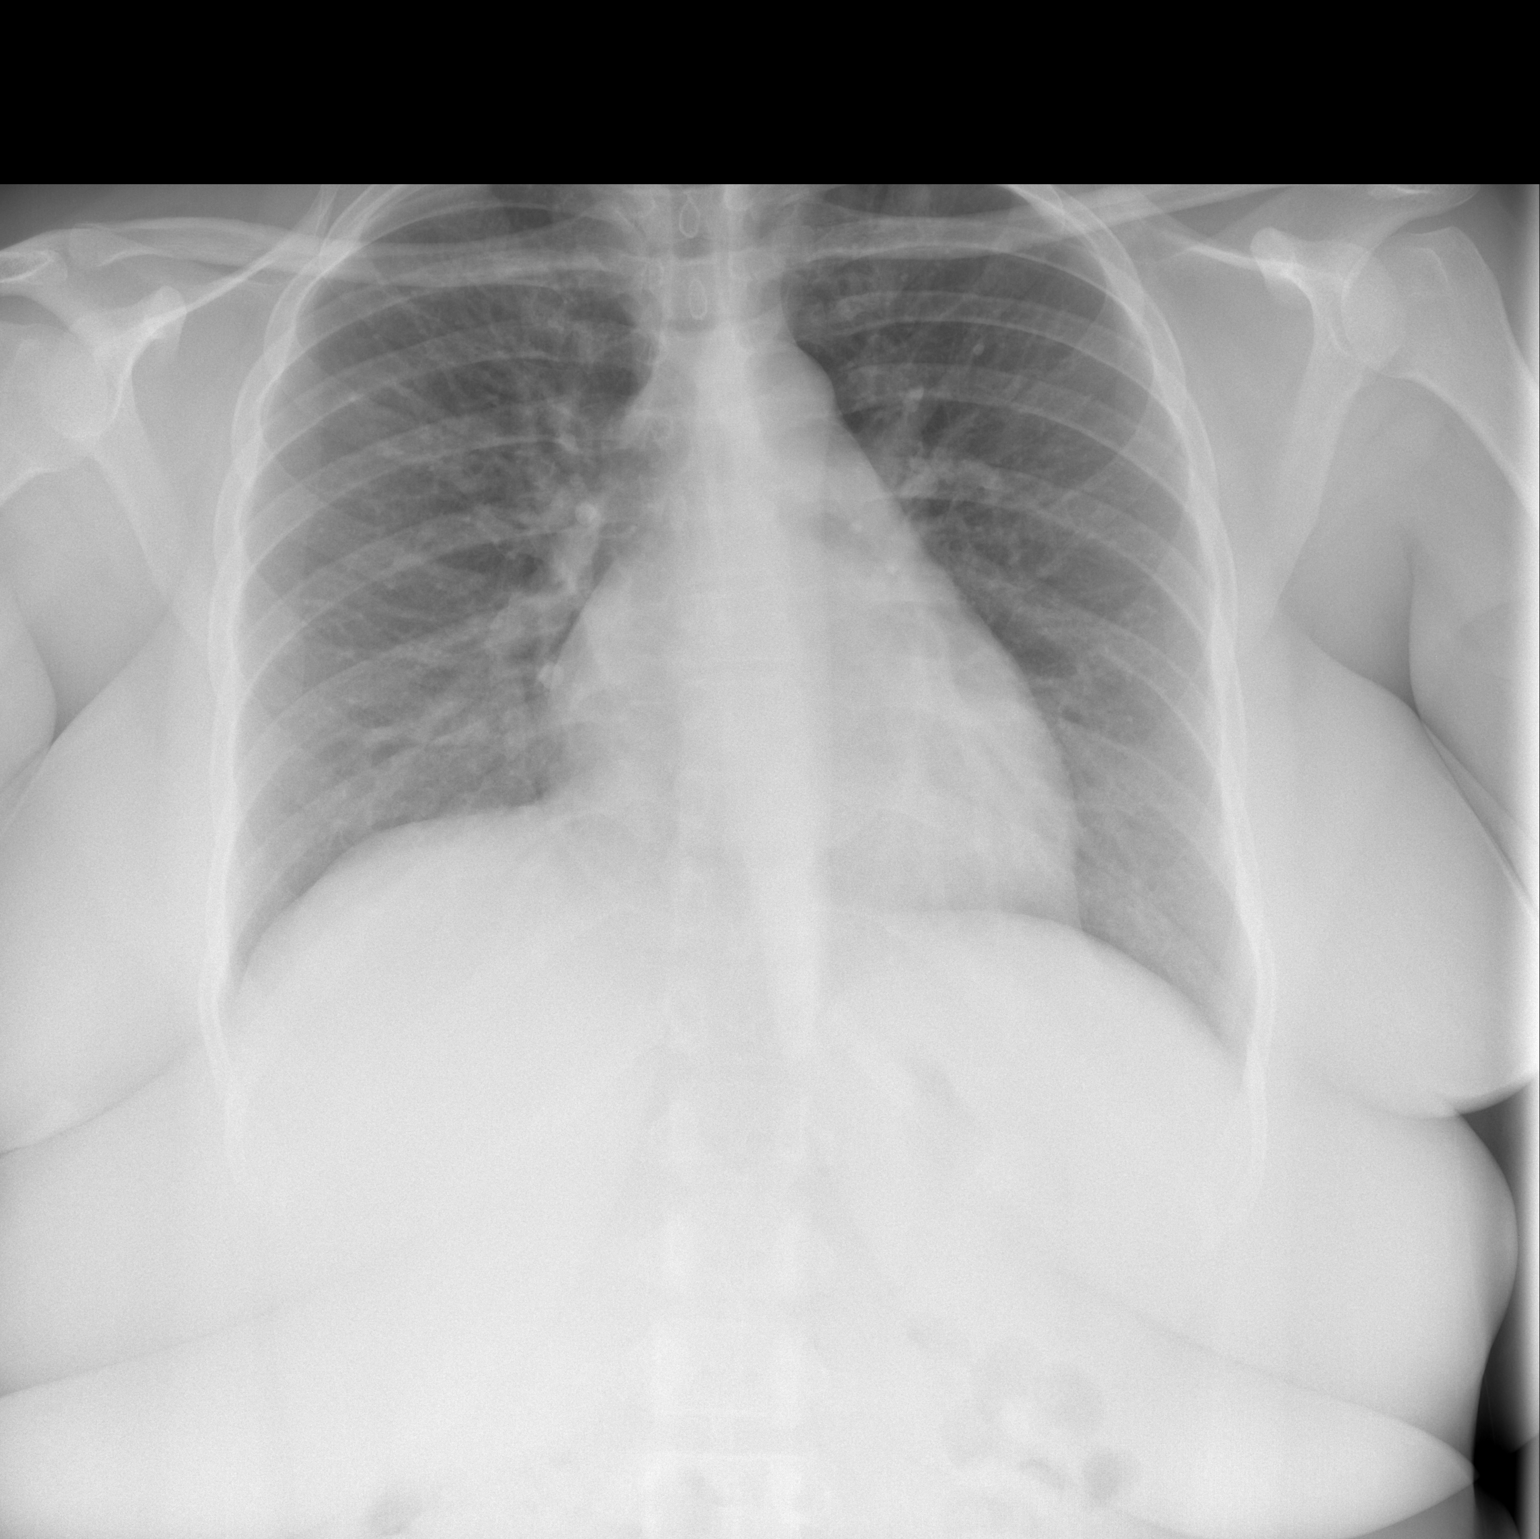

[w chest lat]
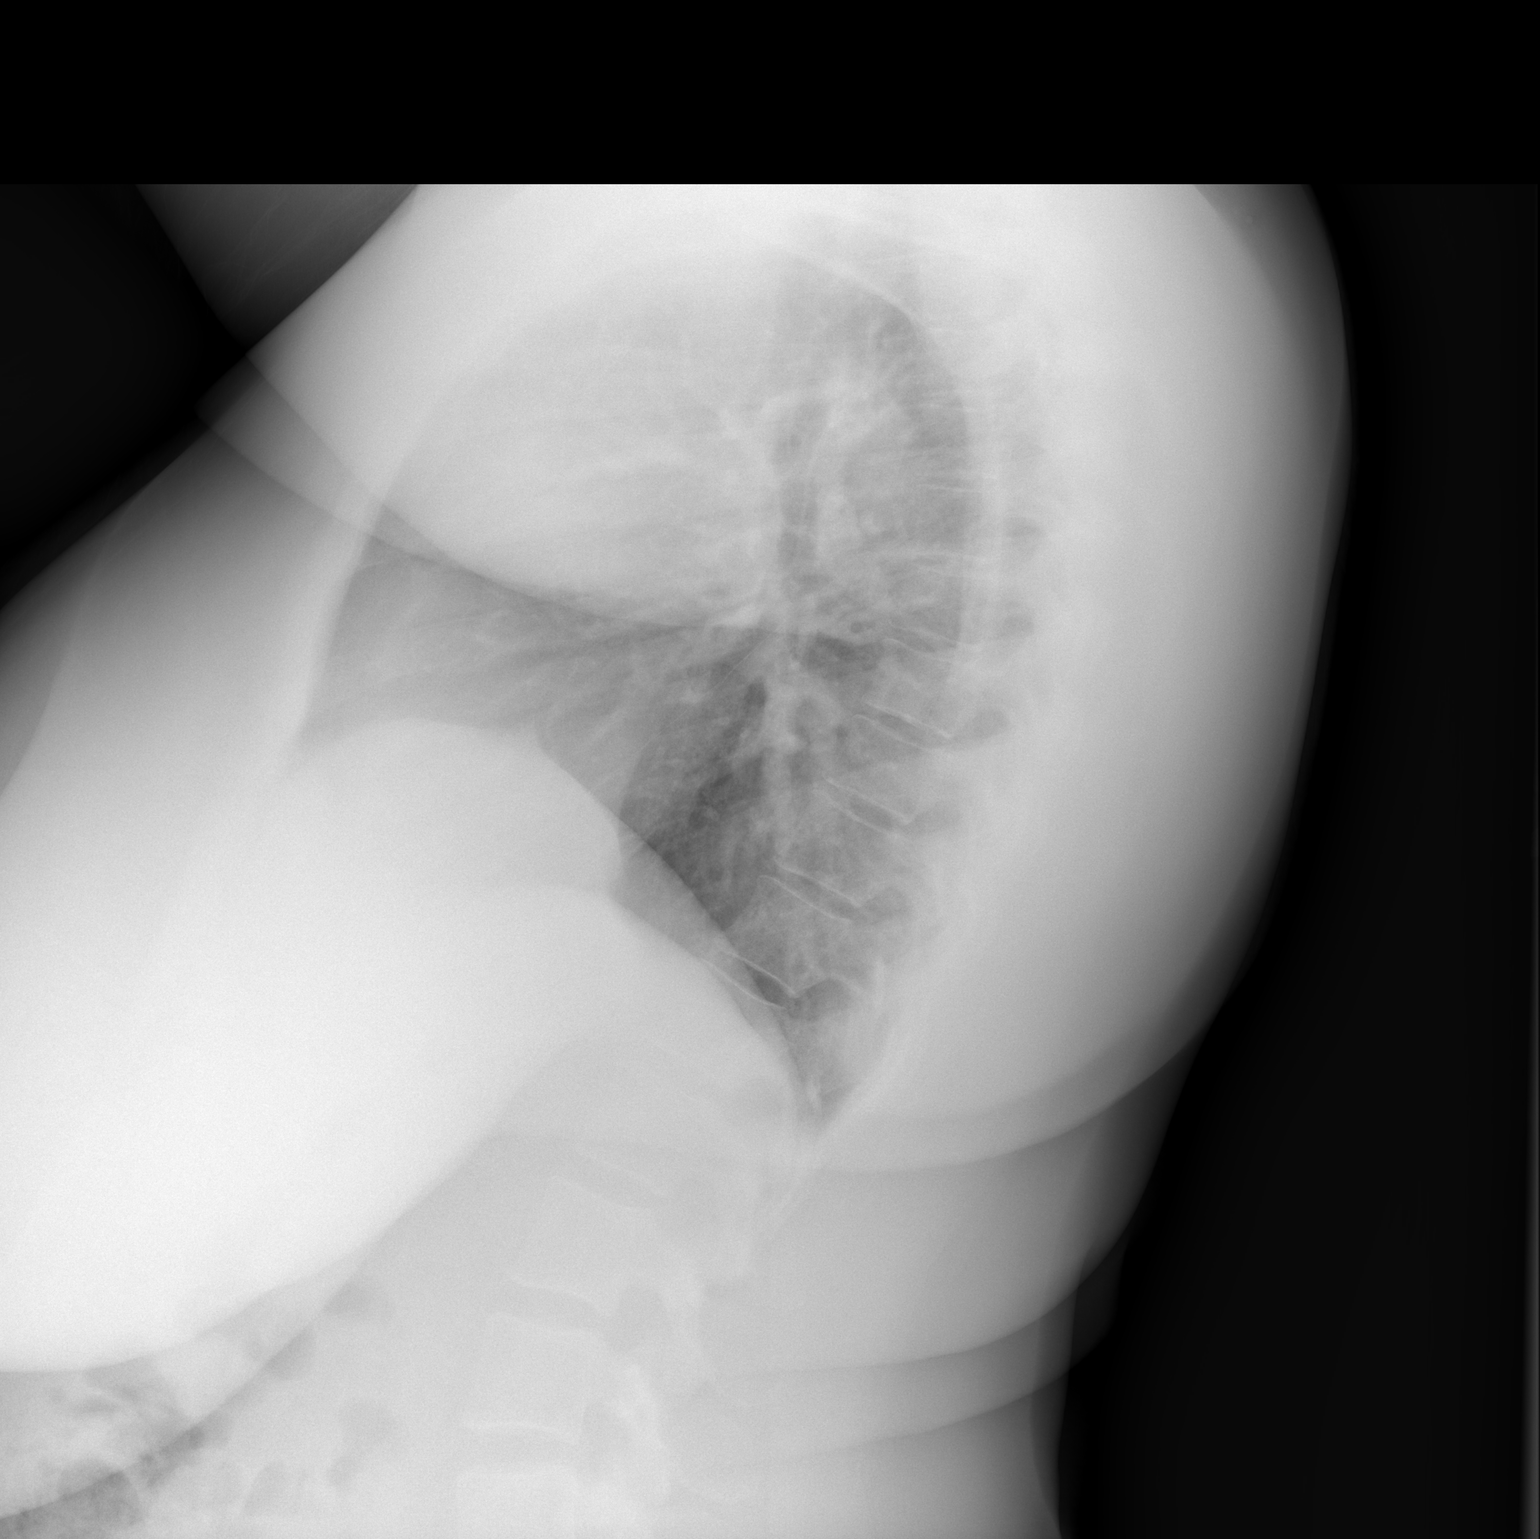

[2 of 2 positions shown; findings below may reference images not displayed]

FINDINGS: Lateral view degraded by patient arm position.

Midline trachea.  Normal heart size and mediastinal contours.

Sharp costophrenic angles.  No pneumothorax.  Clear lungs.
IMPRESSION: No active cardiopulmonary disease.

## 2018-12-17 IMAGING — US US ABDOMEN LIMITED
1 series · 14 of 25 positions shown · non-contrast
Comparison: None.

CLINICAL DATA: Morbid obesity.

EXAM:
ULTRASOUND ABDOMEN LIMITED RIGHT UPPER QUADRANT

[Series 1: us abdomen limited · 14 of 45 slices shown]
[im 1/45]
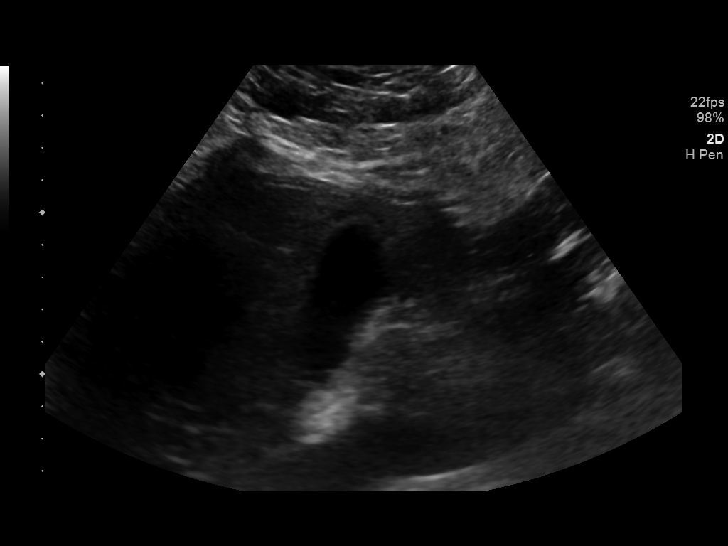
[im 4/45]
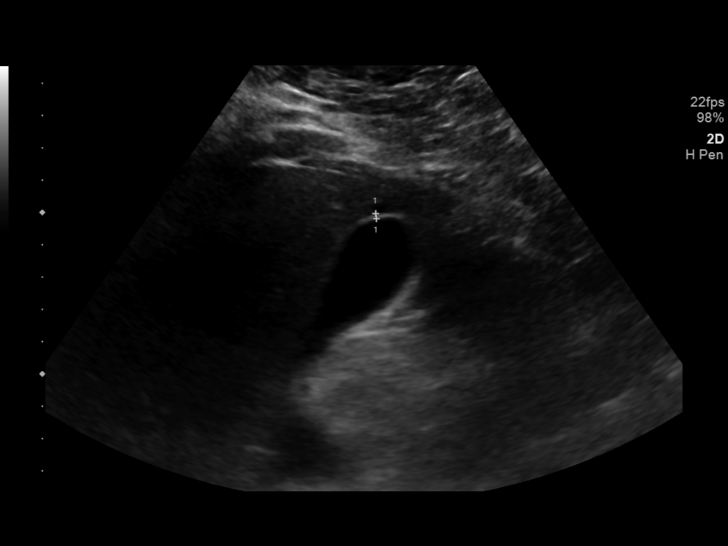
[im 8/45]
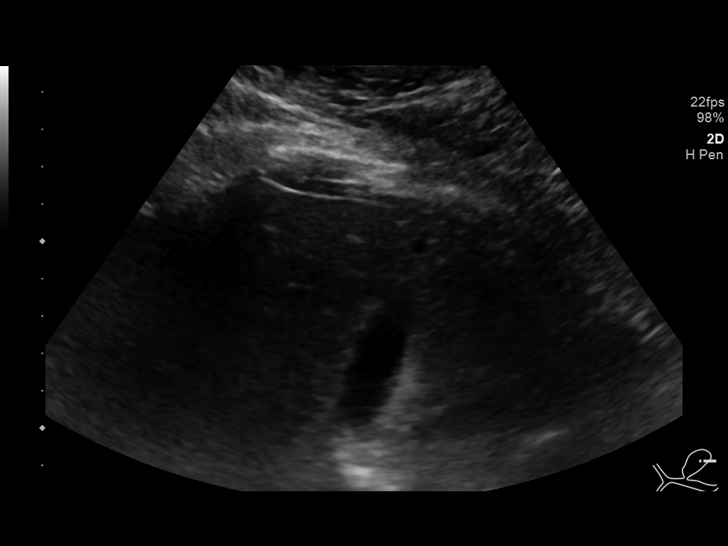
[im 12/45]
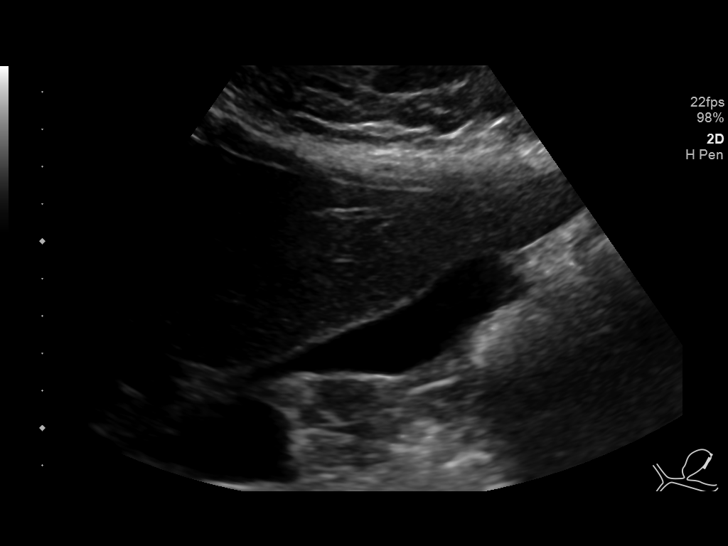
[im 15/45]
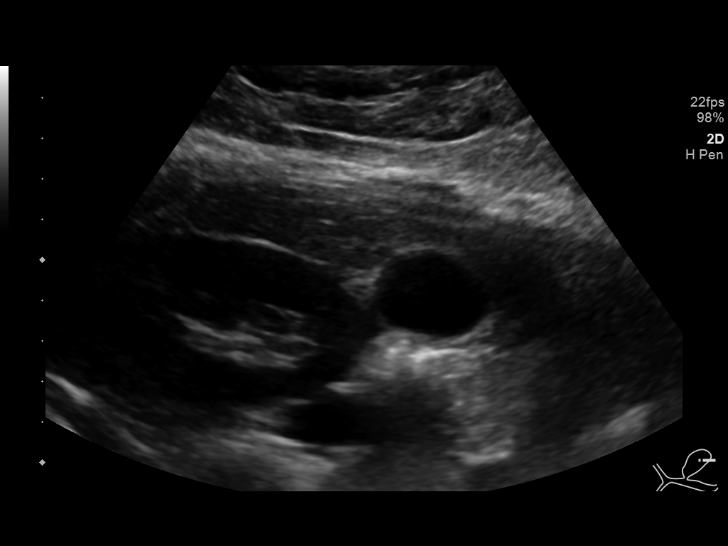
[im 17/45]
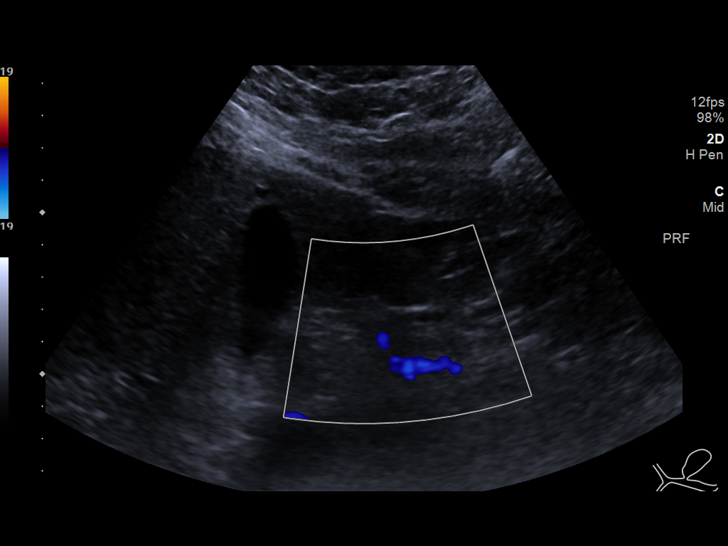
[im 21/45]
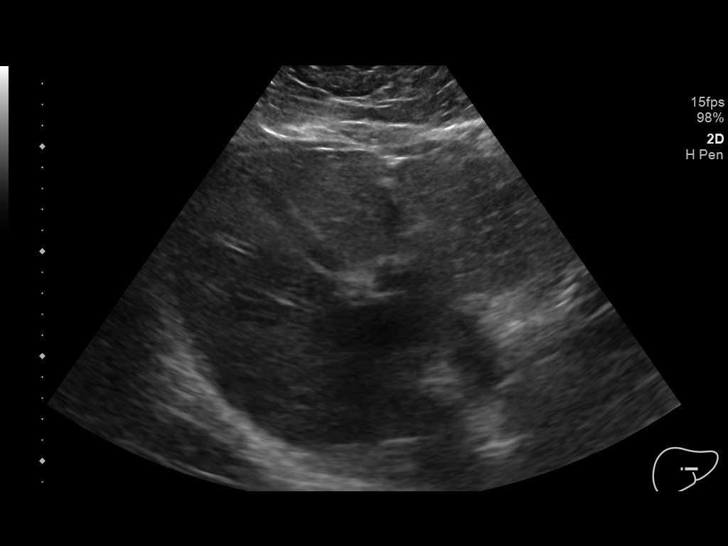
[im 24/45]
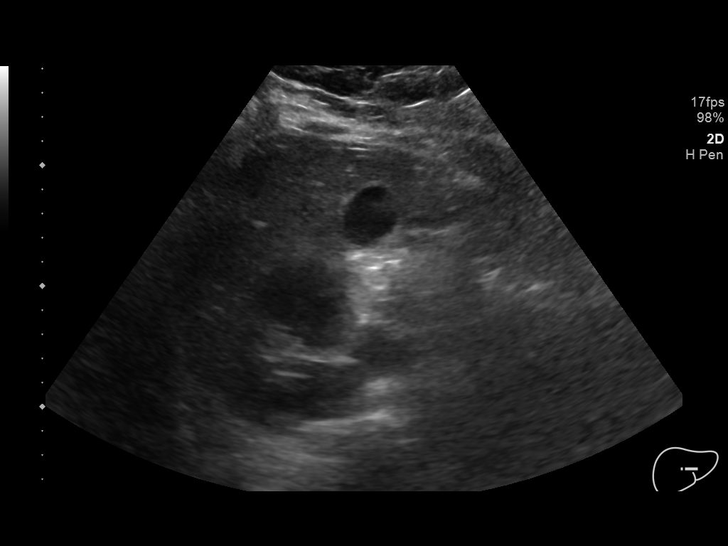
[im 28/45]
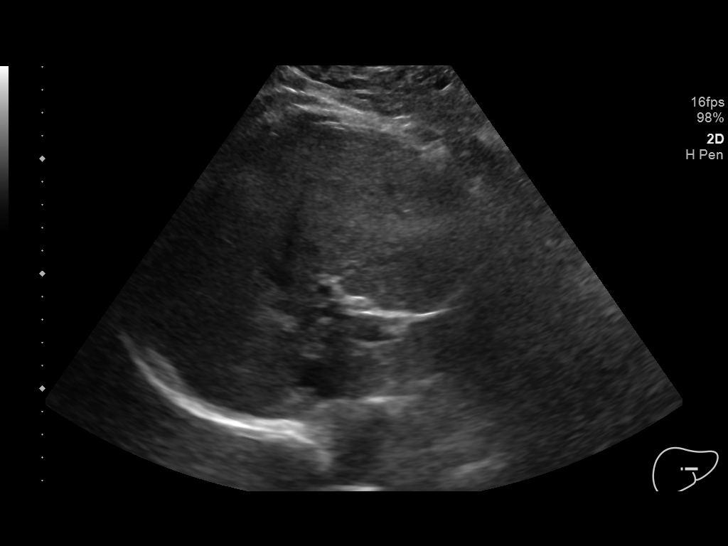
[im 30/45]
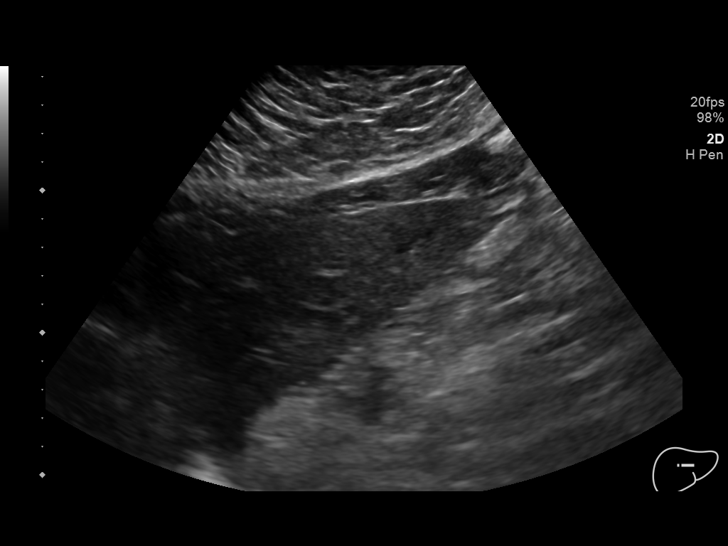
[im 34/45]
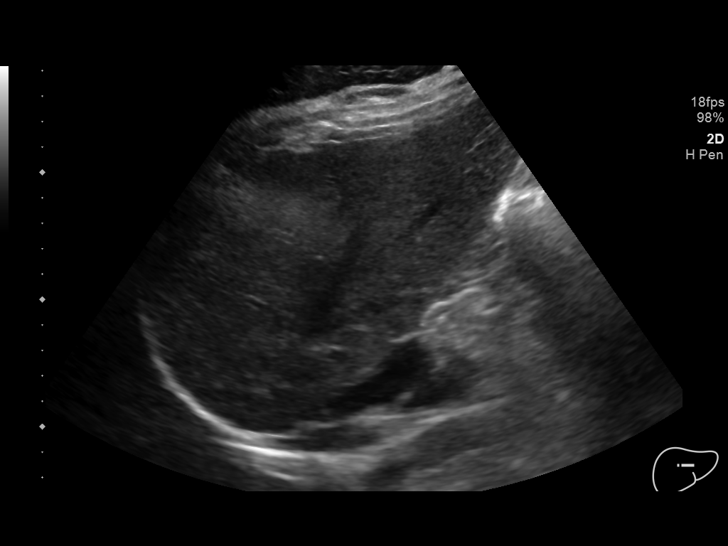
[im 37/45]
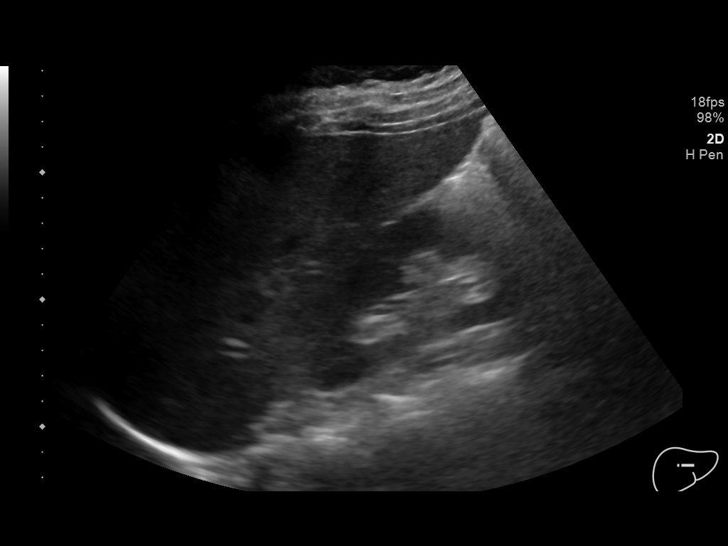
[im 41/45]
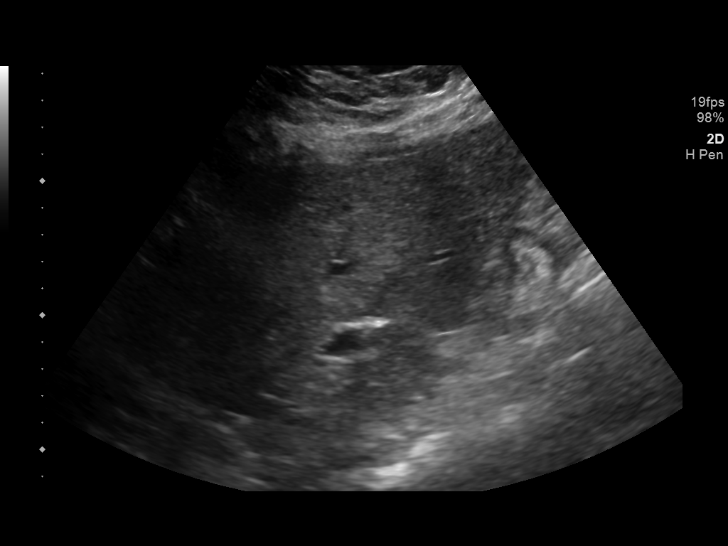
[im 45/45]
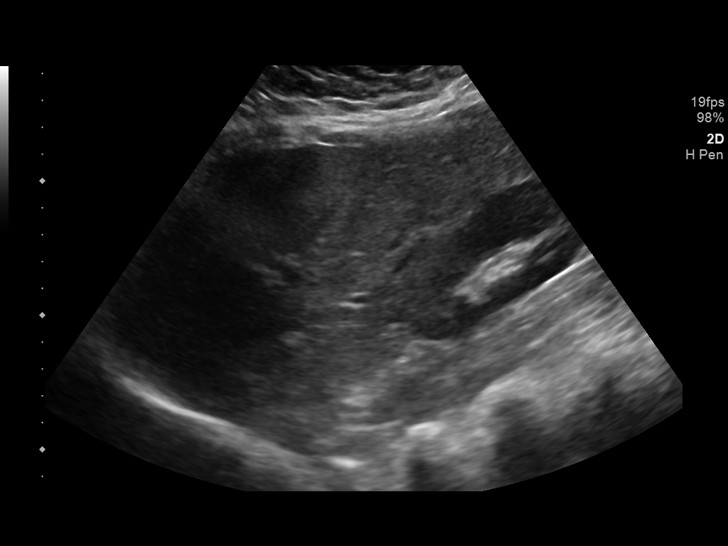

[14 of 25 positions shown; findings below may reference images not displayed]

FINDINGS: Gallbladder:

No gallstones or wall thickening visualized. No sonographic Murphy
sign noted by sonographer.

Common bile duct:

Diameter: 4 mm, within normal limits.

Liver:

No focal lesion identified. Within normal limits in parenchymal
echogenicity. Portal vein is patent on color Doppler imaging with
normal direction of blood flow towards the liver.
IMPRESSION: Normal exam.

## 2019-03-23 ENCOUNTER — Encounter (HOSPITAL_BASED_OUTPATIENT_CLINIC_OR_DEPARTMENT_OTHER): Payer: Self-pay | Admitting: Emergency Medicine

## 2019-03-23 ENCOUNTER — Other Ambulatory Visit: Payer: Self-pay

## 2019-03-23 ENCOUNTER — Emergency Department (HOSPITAL_BASED_OUTPATIENT_CLINIC_OR_DEPARTMENT_OTHER)
Admission: EM | Admit: 2019-03-23 | Discharge: 2019-03-23 | Disposition: A | Discharge status: Home / Self Care | Payer: No Typology Code available for payment source | Attending: Emergency Medicine | Admitting: Emergency Medicine

## 2019-03-23 DIAGNOSIS — Z9884 Bariatric surgery status: Secondary | ICD-10-CM | POA: Insufficient documentation

## 2019-03-23 DIAGNOSIS — J45909 Unspecified asthma, uncomplicated: Secondary | ICD-10-CM | POA: Diagnosis not present

## 2019-03-23 DIAGNOSIS — Z20828 Contact with and (suspected) exposure to other viral communicable diseases: Secondary | ICD-10-CM | POA: Diagnosis not present

## 2019-03-23 DIAGNOSIS — R519 Headache, unspecified: Secondary | ICD-10-CM | POA: Insufficient documentation

## 2019-03-23 DIAGNOSIS — R7309 Other abnormal glucose: Secondary | ICD-10-CM | POA: Diagnosis not present

## 2019-03-23 DIAGNOSIS — H5713 Ocular pain, bilateral: Secondary | ICD-10-CM | POA: Diagnosis present

## 2019-03-23 DIAGNOSIS — Z79899 Other long term (current) drug therapy: Secondary | ICD-10-CM | POA: Insufficient documentation

## 2019-03-23 DIAGNOSIS — Z7984 Long term (current) use of oral hypoglycemic drugs: Secondary | ICD-10-CM | POA: Insufficient documentation

## 2019-03-23 DIAGNOSIS — R0981 Nasal congestion: Secondary | ICD-10-CM

## 2019-03-23 MED ORDER — TETRACAINE HCL 0.5 % OP SOLN: 2.0000 [drp] | Freq: Once | OPHTHALMIC | Status: DC

## 2019-03-23 MED ORDER — FLUORESCEIN SODIUM 1 MG OP STRP: 2.0000 | ORAL_STRIP | Freq: Once | OPHTHALMIC | Status: DC

## 2019-03-23 NOTE — Discharge Instructions (Signed)
Please read and follow all provided instructions.  Your diagnoses today include:  1. Nasal congestion   2. Pain of both eyes     Tests performed today include:  Vital signs. See below for your results today.   COVID testing - should return in 18-24 hours  Medications prescribed:   None  Home care instructions:  Follow any educational materials contained in this packet.  Follow-up instructions: Please follow-up with your primary care provider as needed for further evaluation of your symptoms.  Return instructions:   Please return to the Emergency Department if you experience worsening symptoms.   Please return if you have any other emergent concerns.  Additional Information:  Your vital signs today were: BP 133/85 (BP Location: Right Arm)    Pulse 70    Temp 97.9 F (36.6 C) (Oral)    Resp 14    Ht 5\' 3"  (1.6 m)    Wt 80.7 kg    LMP 02/21/2019    SpO2 100%    BMI 31.53 kg/m  If your blood pressure (BP) was elevated above 135/85 this visit, please have this repeated by your doctor within one month. ---------------

## 2019-03-23 NOTE — ED Triage Notes (Signed)
Bilateral eye pain for 2 days.  No blurry vision.  Slight headache.

## 2019-03-23 NOTE — ED Provider Notes (Signed)
[facility] EMERGENCY DEPARTMENT Provider Note   CSN: [id number] Arrival date & time: 03/23/19  1033     History   Chief Complaint Chief Complaint  Patient presents with  . Eye Pain    HPI Sydney Dominguez is a 28 y.o. female.     Patient presents for COVID-19 testing.  She reports nasal congestion, mild and bilateral pain behind her eyes over the past 2 to 3 days.  She has a slight headache as well.  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  She denies fevers, cough, chest pain or shortness of breath.  No vision changes.  No head or eye injuries or foreign bodies.  No redness of the eyes or eye drainage.  No ear pain or sore throat.  Daughter was recently exposed to someone with coronavirus.  Daughter is also here for testing today.  No treatments prior to arrival.     Past Medical History:  Diagnosis Date  . Anemia   . Anxiety   . Asthma   . B12 deficiency   . Back pain   . Bipolar disorder (HCC)   . Chest pain   . Constipation   . Depression   . Dyspnea   . GERD (gastroesophageal reflux disease)   . Palpitations   . Prediabetes   . Schizophrenia (HCC)   . Vertigo   . Vitamin D deficiency     Patient Active Problem List   Diagnosis Date Noted  . Morbid obesity with BMI of 40.0-44.9, adult (HCC) 07/04/2018  . Other fatigue 09/20/2017  . Shortness of breath on exertion 09/20/2017  . Prediabetes 09/20/2017  . Vitamin D deficiency 09/20/2017  . Binge eating 06/29/2017  . Severe obesity (BMI >= 40) (HCC) 06/29/2017  . Healthcare maintenance 05/17/2017  . Yeast infection 05/17/2017  . Bipolar depression (HCC) 04/28/2017    Past Surgical History:  Procedure Laterality Date  . GASTRIC ROUX-EN-Y N/A 07/04/2018   Procedure: LAPAROSCOPIC ROUX-EN-Y GASTRIC BYPASS WITH UPPER ENDOSCOPY ERAS Pathway;  Surgeon: Glenna FellowsHoxworth, Benjamin, MD;  Location: [facility];  Service: General;  Laterality: N/A;  . HAND SURGERY     l hand  .  VAGINAL DELIVERY       OB History    Gravida  1   Para  1   Term  1   Preterm  0   AB  0   Living  1     SAB  0   TAB  0   Ectopic  0   Multiple  0   Live Births  1            Home Medications    Prior to Admission medications   Medication Sig Start Date End Date Taking? Authorizing Provider  APPLE CIDER VINEGAR PO Take 10 mLs by mouth 2 (two) times daily.    [provider]  COLLAGEN PO Take 1 tablet by mouth daily.    [provider]  lamoTRIgine (LAMICTAL) 200 MG tablet Take 1 tablet (200 mg total) by mouth daily. Patient not taking: Reported on 06/21/2018 05/18/18   Howard PouchFeng, Lauren, MD  metFORMIN (GLUCOPHAGE) 500 MG tablet Take 1 tablet (500 mg total) by mouth daily with breakfast. Patient not taking: Reported on 06/21/2018 02/20/18   Alois ClicheAguilar, Tracey, PA-C  Multiple Vitamins-Minerals (BARIATRIC MULTIVITAMINS/IRON PO) Take 1 tablet by mouth daily.    [provider]  Omega-3 Fatty Acids (FISH OIL) 500 MG CAPS Take 1 capsule by  mouth daily.    [provider]  omeprazole (PRILOSEC) 40 MG capsule Take 40 mg by mouth daily.    [provider]  oxyCODONE (ROXICODONE) 5 MG/5ML solution Take 5 mLs (5 mg total) by mouth every 4 (four) hours as needed for moderate pain or severe pain. 07/05/18   Glenna Fellows, MD  Spirulina POWD Take 10 mLs by mouth daily. Mix in 8oz. of water    [provider]  Vitamin D, Ergocalciferol, (DRISDOL) 1.25 MG (50000 UT) CAPS capsule Take 1 capsule (50,000 Units total) by mouth every 7 (seven) days. 05/17/18   Alois Cliche, PA-C    Family History Family History  Problem Relation Age of Onset  . Diabetes Mother   . Hypertension Mother   . Thyroid disease Mother   . Depression Mother   . Anxiety disorder Mother   . Obesity Mother   . Hypertension Father   . Hyperlipidemia Father   . Depression Father   . Anxiety disorder Father   . Bipolar disorder Father   . Schizophrenia  Father   . Alcoholism Father   . Eating disorder Father   . Hypertension Maternal Grandmother   . Cancer Maternal Grandfather        prostate  . Anesthesia problems Neg Hx     Social History Social History   Tobacco Use  . Smoking status: Never Smoker  . Smokeless tobacco: Never Used  Substance Use Topics  . Alcohol use: No    Alcohol/week: 0.0 standard drinks  . Drug use: No     Allergies   Blood-group specific substance   Review of Systems Review of Systems  Constitutional: Negative for chills, fatigue and fever.  HENT: Positive for congestion. Negative for ear pain, rhinorrhea, sinus pressure and sore throat.   Eyes: Positive for pain. Negative for photophobia, redness and visual disturbance.  Respiratory: Negative for cough and wheezing.   Gastrointestinal: Negative for abdominal pain, diarrhea, nausea and vomiting.  Genitourinary: Negative for dysuria.  Musculoskeletal: Negative for myalgias and neck stiffness.  Skin: Negative for rash.  Neurological: Positive for headaches.  Hematological: Negative for adenopathy.     Physical Exam Updated Vital Signs BP 133/85 (BP Location: Right Arm)   Pulse 70   Temp 97.9 F (36.6 C) (Oral)   Resp 14   Ht 5\' 3"  (1.6 m)   Wt 80.7 kg   LMP 02/21/2019   SpO2 100%   BMI 31.53 kg/m   Physical Exam Vitals signs and nursing note reviewed.  Constitutional:      Appearance: She is well-developed.  HENT:     Head: Normocephalic and atraumatic.     Jaw: No trismus.     Right Ear: Tympanic membrane, ear canal and external ear normal.     Left Ear: Tympanic membrane, ear canal and external ear normal.     Nose: Nose normal. No mucosal edema or rhinorrhea.     Mouth/Throat:     Mouth: Mucous membranes are not dry. No oral lesions.     Pharynx: Uvula midline. No oropharyngeal exudate, posterior oropharyngeal erythema or uvula swelling.     Tonsils: No tonsillar abscesses.  Eyes:     General:        Right eye: No  discharge.        Left eye: No discharge.     Conjunctiva/sclera: Conjunctivae normal.     Comments: Globes appear normal.  Full range of motion of the eyes without any discomfort.  There  is surrounding erythema or redness.  No proptosis.  No ptosis.  Neck:     Musculoskeletal: Normal range of motion and neck supple.  Cardiovascular:     Rate and Rhythm: Normal rate and regular rhythm.     Heart sounds: Normal heart sounds.  Pulmonary:     Effort: Pulmonary effort is normal. No respiratory distress.     Breath sounds: Normal breath sounds. No wheezing or rales.  Abdominal:     Palpations: Abdomen is soft.     Tenderness: There is no abdominal tenderness.  Lymphadenopathy:     Cervical: No cervical adenopathy.  Skin:    General: Skin is warm and dry.  Neurological:     Mental Status: She is alert.      ED Treatments / Results  Labs (all labs ordered are listed, but only abnormal results are displayed) Labs Reviewed  NOVEL CORONAVIRUS, NAA (HOSP ORDER, SEND-OUT TO REF LAB; TAT 18-24 HRS)    EKG None  Radiology No results found.  Procedures Procedures (including critical care time)  Medications Ordered in ED Medications  tetracaine (PONTOCAINE) 0.5 % ophthalmic solution 2 drop (has no administration in time range)  fluorescein ophthalmic strip 2 strip (has no administration in time range)     Initial Impression / Assessment and Plan / ED Course  I have reviewed the triage vital signs and the nursing notes.  Pertinent labs & imaging results that were available during my care of the patient were reviewed by me and considered in my medical decision making (see chart for details).        Patient seen and examined.  Patient appears well.  We will send coronavirus testing.  Otherwise supportive measures indicated for probable mild URI/sinusitis.  Vital signs reviewed and are as follows: BP 133/85 (BP Location: Right Arm)   Pulse 70   Temp 97.9 F (36.6 C) (Oral)    Resp 14   Ht 5\' 3"  (1.6 m)   Wt 80.7 kg   LMP 02/21/2019   SpO2 100%   BMI 31.53 kg/m   Final Clinical Impressions(s) / ED Diagnoses   Final diagnoses:  Nasal congestion  Pain of both eyes   Patient with eye pain "behind" the eyes.  Globes are normal.  Vision is normal.  No signs of periorbital or orbital cellulitis.  No signs of proptosis or ptosis.  She does have some nasal congestion and mild headache.  Suspect mild URI or sinusitis.  Patient has concern for coronavirus and she has been tested.   ED Discharge Orders    None       Carlisle Cater, Hershal Coria 03/23/19 1122    Hayden Rasmussen, MD 03/23/19 Tresa Moore

## 2019-03-24 LAB — NOVEL CORONAVIRUS, NAA (HOSP ORDER, SEND-OUT TO REF LAB; TAT 18-24 HRS): SARS-CoV-2, NAA: NOT DETECTED

## 2019-10-15 ENCOUNTER — Ambulatory Visit (INDEPENDENT_AMBULATORY_CARE_PROVIDER_SITE_OTHER): Payer: No Typology Code available for payment source | Admitting: Family Medicine

## 2019-10-15 ENCOUNTER — Encounter: Payer: Self-pay | Admitting: Family Medicine

## 2019-10-15 ENCOUNTER — Other Ambulatory Visit: Payer: Self-pay

## 2019-10-15 VITALS — BP 110/68 | HR 68 | Wt 160.4 lb

## 2019-10-15 DIAGNOSIS — R002 Palpitations: Secondary | ICD-10-CM | POA: Diagnosis not present

## 2019-10-15 DIAGNOSIS — R7303 Prediabetes: Secondary | ICD-10-CM

## 2019-10-15 DIAGNOSIS — Z0000 Encounter for general adult medical examination without abnormal findings: Secondary | ICD-10-CM | POA: Diagnosis not present

## 2019-10-15 DIAGNOSIS — E559 Vitamin D deficiency, unspecified: Secondary | ICD-10-CM | POA: Diagnosis not present

## 2019-10-15 DIAGNOSIS — Z9884 Bariatric surgery status: Secondary | ICD-10-CM

## 2019-10-15 LAB — POCT GLYCOSYLATED HEMOGLOBIN (HGB A1C): Hemoglobin A1C: 4.8 % (ref 4.0–5.6)

## 2019-10-15 NOTE — Assessment & Plan Note (Signed)
Intermittent, differential includes PAC, PVC, sinus tachycardia due to anemia. TSH and CBC today. Prior EKG reviewed, NSR without T wave changes. Normal P waves. Has had prior similar complaint.

## 2019-10-15 NOTE — Assessment & Plan Note (Signed)
Repeat levels today

## 2019-10-15 NOTE — Progress Notes (Signed)
SUBJECTIVE:   CHIEF COMPLAINT / HPI:   Sydney Dominguez is a 29 y.o. female with a history of roux en y gastric bypass who presents for a physical and health assessment today.  Patient reports today overall she is doing well.  She works as a Psychologist, sport and exercise.  She has 1 daughter who was home for virtual school the entire year.  She is very pleased with her weight loss.  She does not engage in any high risk behaviors.  She is not currently sexually active.  Patient had several concerns today.  She is taking vitamin D, calcium, iron, and spirulina.  Patient reports palpitations.  She reports these of been intermittent for several years.  She notices her heart rate at baseline is typically in the 60s now.  At work sometimes will go up to the 140s for a minute or 2.  Denies chest pain or dyspnea on exertion.  She denies orthopnea paroxysmal nocturnal dyspnea.  She does endorse symptoms of anemia including pica.  She endorses normal menses, typically actually very light.  She denies melena or hematochezia.  She is not taking any NSAIDs.  The patient endorses intermittent menstrual cramps.  She reports her periods are relatively light.  Last typically 6 days.  They are regular in nature.  She is not using anything for contraception.  She cannot take NSAIDs due to history of Roux-en-Y.  PMH also notable for anxiety, palpitations.   Roux en y gastric bypass follow up Patient reports no concerns since the surgery, although she does want to get labs.  Any concerns? - havent been back since the visit, no lab work and would like to see levels How is eating? No difficulties  Dysphagia? - none Nausea, vomiting - sometimes a little nausea, may be the foods (fried chicken)  Diarrhea - none Reflux - none Early satiety  Health Maintenance Contraceptive use - none, some abstinence and sometimes protection, one partner and monogamous (not currently sexually active)  Bipolar f/u - none covid vaccine? - wait  a little bit Pap smear  Egg allergy - break out with hives and tongue swells, no SOB  PERTINENT  PMH / PSH: roux en y gastric bypass, bipolar disorder, B12 deficiency  OBJECTIVE:   BP 110/68    Pulse 68    Wt 160 lb 6.4 oz (72.8 kg)    SpO2 98%    BMI 28.41 kg/m   HEENT: Sclera anicteric. Dentition is moderate. Appears well hydrated. Neck: Supple Cardiac: Regular rate and rhythm. Normal S1/S2. No murmurs, rubs, or gallops appreciated. Lungs: Clear bilaterally to ascultation.  Extremities: Warm, well perfused without edema.  Skin: Warm, dry Psych: Pleasant and appropriate   ASSESSMENT/PLAN:   Healthcare maintenance We discussed healthy eating habits, moderate physical activity for 30 minutes 5 times per week (or 150 minutes), safe sex practices, avoiding tobacco products, safe alcohol consumption, and safe driving habits.  Congratulated on weight loss.   Vitamin D deficiency Repeat levels today.   Prediabetes A1C today.   Palpitations Intermittent, differential includes PAC, PVC, sinus tachycardia due to anemia. TSH and CBC today. Prior EKG reviewed, NSR without T wave changes. Normal P waves. Has had prior similar complaint.    Discussed COVID vaccine.  UTD on Pap.  Discussed contraception---she is not currently sexually active, will let us know when interested.  Will explore options for menstrual cramps that are nonmedication ginger or fenugreek.   Westley Chandler, MD [facility]  Patient seen along with MS3 student Branden Mabe. I personally evaluated this patient along with the student, and verified all aspects of the history, physical exam, and medical decision making as documented by the student. I agree with the student's documentation and have made all necessary edits.

## 2019-10-15 NOTE — Assessment & Plan Note (Signed)
We discussed healthy eating habits, moderate physical activity for 30 minutes 5 times per week (or 150 minutes), safe sex practices, avoiding tobacco products, safe alcohol consumption, and safe driving habits.  Congratulated on weight loss.

## 2019-10-15 NOTE — Patient Instructions (Addendum)
It was wonderful to see you today.  Please bring ALL of your medications with you to every visit.   Today we talked about:  I will look into the herbal supplement   I will send you the results of your labs via MyChart    Thank you for choosing [facility].   Please call [phone] with any questions about today's appointment.  Please be sure to schedule follow up at the front  desk before you leave today.  6 months for Pap   Terisa Starr, MD  Family Medicine    I highly recommend the COVID vaccine. These vaccines have excellent safety data. Please ask ANY questions you may have.   [place] has multiple sites for vaccination---appointments are encouraged (online or call the number below) but you can also just walk in.    [address] [place])  [place] [address] [place], [place] [address] Hours: Mon,Thu 8-5, Sat 8-12 Type: [employer]   [address]  [facility] [place])  [employer] [employer] [address] [place], [place] [address] Hours: Thu: 1-5 Type: Pfizer & Moderna  Phone Assistance: Please call [phone], Monday through Friday between 7 a.m. and 7 p.m.  NO ID Required For Vaccine Clinics: Vaccine providers may ask for an ID for insurance or HRSA reimbursement purposes. However, everyone will be vaccinated even if they don't present an ID. No one will be turned away.   Other Vaccine Locations: Find all vaccine locations throughout the state of [place] at [email or web address]

## 2019-10-15 NOTE — Assessment & Plan Note (Signed)
A1C today. 

## 2019-10-16 ENCOUNTER — Telehealth: Payer: Self-pay | Admitting: Family Medicine

## 2019-10-16 DIAGNOSIS — E611 Iron deficiency: Secondary | ICD-10-CM

## 2019-10-16 LAB — COMPREHENSIVE METABOLIC PANEL
ALT: 11 IU/L (ref 0–32)
AST: 18 IU/L (ref 0–40)
Albumin/Globulin Ratio: 2 (ref 1.2–2.2)
Albumin: 3.9 g/dL (ref 3.9–5.0)
Alkaline Phosphatase: 52 IU/L (ref 48–121)
BUN/Creatinine Ratio: 14 (ref 9–23)
BUN: 9 mg/dL (ref 6–20)
Bilirubin Total: 0.2 mg/dL (ref 0.0–1.2)
CO2: 22 mmol/L (ref 20–29)
Calcium: 9.5 mg/dL (ref 8.7–10.2)
Chloride: 103 mmol/L (ref 96–106)
Creatinine, Ser: 0.65 mg/dL (ref 0.57–1.00)
GFR calc Af Amer: 138 mL/min/{1.73_m2} (ref >59)
GFR calc non Af Amer: 120 mL/min/{1.73_m2} (ref >59)
Globulin, Total: 2 g/dL (ref 1.5–4.5)
Glucose: 78 mg/dL (ref 65–99)
Potassium: 4.5 mmol/L (ref 3.5–5.2)
Sodium: 139 mmol/L (ref 134–144)
Total Protein: 5.9 g/dL — ABNORMAL LOW (ref 6.0–8.5)

## 2019-10-16 LAB — FERRITIN: Ferritin: 23 ng/mL (ref 15–150)

## 2019-10-16 LAB — VITAMIN B12: Vitamin B-12: 584 pg/mL (ref 232–1245)

## 2019-10-16 LAB — CBC
Hematocrit: 38.1 % (ref 34.0–46.6)
Hemoglobin: 12.1 g/dL (ref 11.1–15.9)
MCH: 26.4 pg — ABNORMAL LOW (ref 26.6–33.0)
MCHC: 31.8 g/dL (ref 31.5–35.7)
MCV: 83 fL (ref 79–97)
Platelets: 371 10*3/uL (ref 150–450)
RBC: 4.58 x10E6/uL (ref 3.77–5.28)
RDW: 20.5 % — ABNORMAL HIGH (ref 11.7–15.4)
WBC: 6.5 10*3/uL (ref 3.4–10.8)

## 2019-10-16 LAB — FOLATE: Folate: 9.3 ng/mL (ref >3.0)

## 2019-10-16 LAB — TSH: TSH: 2.09 u[IU]/mL (ref 0.450–4.500)

## 2019-10-16 LAB — VITAMIN D 25 HYDROXY (VIT D DEFICIENCY, FRACTURES): Vit D, 25-Hydroxy: 70.3 ng/mL (ref 30.0–100.0)

## 2019-10-16 MED ORDER — FERROUS SULFATE 324 (65 FE) MG PO TBEC: DELAYED_RELEASE_TABLET | ORAL | 3 refills | Status: DC

## 2019-10-16 NOTE — Telephone Encounter (Signed)
Called with normal blood work.  Ferritin slightly low--- start every other day iron.  She is to keep diary/log of palpitations.  Discussed fenugreek for cramps, discussed side effects.  Terisa Starr, MD  Family Medicine Teaching Service

## 2020-01-30 ENCOUNTER — Encounter (HOSPITAL_COMMUNITY): Payer: Self-pay

## 2020-11-12 ENCOUNTER — Ambulatory Visit: Payer: No Typology Code available for payment source | Admitting: Medical-Surgical

## 2020-11-12 DIAGNOSIS — Z7689 Persons encountering health services in other specified circumstances: Secondary | ICD-10-CM

## 2021-01-29 ENCOUNTER — Encounter (HOSPITAL_COMMUNITY): Payer: Self-pay | Admitting: *Deleted

## 2021-09-02 ENCOUNTER — Other Ambulatory Visit (HOSPITAL_COMMUNITY)
Admission: RE | Admit: 2021-09-02 | Discharge: 2021-09-02 | Disposition: A | Discharge status: Home / Self Care | Payer: No Typology Code available for payment source | Source: Ambulatory Visit | Attending: Family Medicine | Admitting: Family Medicine

## 2021-09-02 ENCOUNTER — Ambulatory Visit (INDEPENDENT_AMBULATORY_CARE_PROVIDER_SITE_OTHER): Payer: No Typology Code available for payment source | Admitting: Family Medicine

## 2021-09-02 ENCOUNTER — Encounter: Payer: Self-pay | Admitting: Family Medicine

## 2021-09-02 VITALS — BP 127/79 | HR 70 | Ht 63.0 in | Wt 184.6 lb

## 2021-09-02 DIAGNOSIS — Z1159 Encounter for screening for other viral diseases: Secondary | ICD-10-CM

## 2021-09-02 DIAGNOSIS — Z124 Encounter for screening for malignant neoplasm of cervix: Secondary | ICD-10-CM

## 2021-09-02 DIAGNOSIS — Z113 Encounter for screening for infections with a predominantly sexual mode of transmission: Secondary | ICD-10-CM | POA: Diagnosis not present

## 2021-09-02 DIAGNOSIS — R21 Rash and other nonspecific skin eruption: Secondary | ICD-10-CM | POA: Diagnosis not present

## 2021-09-02 NOTE — Patient Instructions (Addendum)
-   I will notify you of your results via MyChart if normal.  Otherwise I will give you a call. ?- Highly consider a form of birth control or at least 400 mcg of folate acid if trying to conceive ?- Make an appointment with our dermatology clinic for a skin biopsy.  ?

## 2021-09-02 NOTE — Progress Notes (Signed)
? ? ?  SUBJECTIVE:  ? ?CHIEF COMPLAINT / HPI:  ? ?Pap Smear ?Last Pap smear- 3 years prior and wnl.  ?LMP-stopped menstruating 1 day ago ?Contraception: None.  Declines today. ?Sexually Active: Yes ?Desire for STD Screening: Yes ?Concerns: Rash-appeared April 4 on left inside of forearm and left breast and right abdomen.  Denies new foods, change in cosmetics, lotions, detergents.  She is mostly concerned that it could be cancerous. ? ?PERTINENT  PMH / PSH: As above. ? ?OBJECTIVE:  ? ?BP 127/79   Pulse 70   Ht 5\' 3"  (1.6 m)   Wt 184 lb 9.6 oz (83.7 kg)   SpO2 100%   BMI 32.70 kg/m?   ?General: Appears well, no acute distress. Age appropriate. ?Respiratory: normal effort ?Pelvic exam: normal external genitalia, vulva, vagina, cervix, uterus and adnexa, CERVIX: normal appearing cervix without discharge or lesions, PAP: Pap smear done today, WET MOUNT done - results: pending, exam chaperoned by Leonia Corona, CMA. ?Skin: Hyperpigmented, purple, papular, polygonal rash located on inside of left forearm and under left breast as well as right side of abdomen.  The most odd shaped was photographed below. ?Neuro: alert and oriented ?Psych: normal affect ? ?  ?Media Information ?Document Information ? ?Photos  ?  ?09/02/2021 10:12  ?Attached To:  ?Office Visit on 09/02/21 with Autry-Lott, Naaman Plummer, DO  ? ?Source Information ? ?Autry-Lott, [facility] Med Resident  ? ?ASSESSMENT/PLAN:  ? ?1. Encounter for screening for cervical cancer ?- Cytology - PAP([place]) ? ?2. Screening examination for sexually transmitted disease ?- Cervicovaginal ancillary only ?- RPR ? ?3. Screening for viral disease ?- HIV antibody (with reflex) ? ?4. Rash ?A little greater than 1 month of rash appearance.  Spreading across body.  Features and characteristics of lichen planus.  Patient concern for cancer.  Planned to offer punch biopsy today but upon returning to exam room patient had left clinic without notice.  MyChart message  sent to patient to schedule with our dermatology clinic. ? ?Gerlene Fee, DO ?[place]  ?

## 2021-09-03 LAB — CERVICOVAGINAL ANCILLARY ONLY
Bacterial Vaginitis (gardnerella): NEGATIVE
Candida Glabrata: NEGATIVE
Candida Vaginitis: NEGATIVE
Chlamydia: NEGATIVE
Comment: NEGATIVE
Comment: NEGATIVE
Comment: NEGATIVE
Comment: NEGATIVE
Comment: NEGATIVE
Comment: NORMAL
Neisseria Gonorrhea: NEGATIVE
Trichomonas: NEGATIVE

## 2021-09-04 LAB — CYTOLOGY - PAP
Comment: NEGATIVE
Diagnosis: NEGATIVE
High risk HPV: NEGATIVE

## 2021-09-10 ENCOUNTER — Ambulatory Visit (INDEPENDENT_AMBULATORY_CARE_PROVIDER_SITE_OTHER): Payer: No Typology Code available for payment source | Admitting: Student

## 2021-09-10 VITALS — BP 110/80 | HR 86 | Temp 98.4°F | Ht 63.0 in | Wt 184.4 lb

## 2021-09-10 DIAGNOSIS — R21 Rash and other nonspecific skin eruption: Secondary | ICD-10-CM | POA: Diagnosis not present

## 2021-09-10 NOTE — Patient Instructions (Signed)
It was great to see you today! Thank you for choosing [facility] for your primary care. Sydney Dominguez was seen for skin biopsy for unknown rash.  Today we addressed: We took 1 biopsy today and we will be in touch with you about the results.  Please follow the instructions on the wound care sheet that you are provided at the end of the visit.  If you haven't already, sign up for My Chart to have easy access to your labs results, and communication with your primary care physician.  We are checking some labs today. If they are abnormal, I will call you. If they are normal, I will send you a MyChart message (if it is active) or a letter in the mail. If you do not hear about your labs in the next 2 weeks, please call the office.   Please arrive 15 minutes before your appointment to ensure smooth check in process.  We appreciate your efforts in making this happen.  Please call the clinic at [phone] if your symptoms worsen or you have any concerns.  Thank you for allowing me to participate in your care, Wells Guiles, DO 09/10/2021, 9:54 AM PGY-1, [place]

## 2021-09-11 DIAGNOSIS — L280 Lichen simplex chronicus: Secondary | ICD-10-CM | POA: Insufficient documentation

## 2021-09-11 DIAGNOSIS — R21 Rash and other nonspecific skin eruption: Secondary | ICD-10-CM | POA: Insufficient documentation

## 2021-09-11 NOTE — Progress Notes (Signed)
  SUBJECTIVE:   CHIEF COMPLAINT / HPI:   Rash: Appeared early April on left upper arm as several dark spots and have grown together. Also present on her left breast, right flank, and left thigh. They do not irritate her in any way other than appearance. She is concerned they are cancerous.   OBJECTIVE:  BP 110/80   Pulse 86   Temp 98.4 F (36.9 C)   Ht 5\' 3"  (1.6 m)   Wt 184 lb 6.4 oz (83.6 kg)   SpO2 100%   BMI 32.66 kg/m   General: NAD, pleasant, able to participate in exam Skin: warm and dry, hyperpigmented, poorly circumscribed macular lesions on left upper arm, left breast, right flank and left thigh  Pre-op Diagnosis: hyperpigmented lesion of unknown origin Post-op Diagnosis: Same Procedure: Punch Skin Biopsy Location: left upper inner arm Performing Physician: , DO Supervising Physician (if applicable): Shelby Mattocks, MD  Informed consent was obtained prior to the procedure. Time out performed. The area surrounding the skin lesion was prepared and cleaned with povidone-iodine swab stick and wiped clean with alcohol prep.  A size 75mm disposable biopsy punch tool was used to obtain tissue sample and placed in labeled biopsy cup. Silver nitrate was used to obtain hemostasis. The site was not closed. The patient tolerated the procedure well without complications.  Standard post-procedure care was explained and return precautions and wound care handout given.  ASSESSMENT/PLAN:  Rash and nonspecific skin eruption Punch biopsy obtained.   1m, DO 09/11/2021, 9:38 AM PGY-1, [facility]

## 2021-09-11 NOTE — Assessment & Plan Note (Addendum)
Punch biopsy obtained.

## 2021-09-17 ENCOUNTER — Ambulatory Visit: Payer: No Typology Code available for payment source | Admitting: Medical-Surgical

## 2021-09-17 ENCOUNTER — Telehealth: Payer: Self-pay | Admitting: Student

## 2021-09-17 ENCOUNTER — Ambulatory Visit: Payer: No Typology Code available for payment source

## 2021-09-17 NOTE — Telephone Encounter (Signed)
Left HIPAA compliant VM to call back or check mychart for results of recent biopsy.

## 2021-09-17 NOTE — Telephone Encounter (Signed)
Patient returns call to nurse line.   Results of biopsy given.   Patient would like to move forward with steroid ointment. Patient reports itchiness and irritation.   Will forward to provider.

## 2021-09-18 ENCOUNTER — Other Ambulatory Visit: Payer: Self-pay | Admitting: Student

## 2021-09-18 ENCOUNTER — Other Ambulatory Visit (HOSPITAL_COMMUNITY): Payer: Self-pay

## 2021-09-18 DIAGNOSIS — L280 Lichen simplex chronicus: Secondary | ICD-10-CM

## 2021-09-18 MED ORDER — TRIAMCINOLONE ACETONIDE 0.1 % EX OINT
TOPICAL_OINTMENT | CUTANEOUS | 0 refills | Status: DC
  Filled 2021-09-18: qty 15, 7d supply, fill #0

## 2021-09-22 ENCOUNTER — Encounter: Payer: Self-pay | Admitting: *Deleted

## 2021-09-30 ENCOUNTER — Other Ambulatory Visit (HOSPITAL_COMMUNITY): Payer: Self-pay

## 2022-09-29 ENCOUNTER — Other Ambulatory Visit (HOSPITAL_COMMUNITY): Payer: Self-pay

## 2022-09-29 ENCOUNTER — Ambulatory Visit (INDEPENDENT_AMBULATORY_CARE_PROVIDER_SITE_OTHER): Payer: 59 | Admitting: Student

## 2022-09-29 VITALS — BP 119/65 | HR 83 | Ht 63.0 in | Wt 168.2 lb

## 2022-09-29 DIAGNOSIS — L509 Urticaria, unspecified: Secondary | ICD-10-CM

## 2022-09-29 MED ORDER — EPINEPHRINE 0.3 MG/0.3ML IJ SOAJ
0.3000 mg | INTRAMUSCULAR | 2 refills | Status: AC | PRN
  Filled 2022-09-29: qty 2, 30d supply, fill #0

## 2022-09-29 MED ORDER — LORATADINE 10 MG PO TABS
10.0000 mg | ORAL_TABLET | Freq: Two times a day (BID) | ORAL | 11 refills | Status: AC
  Filled 2022-09-29: qty 30, 15d supply, fill #0
  Filled 2022-10-22: qty 100, 50d supply, fill #0

## 2022-09-29 MED ORDER — FAMOTIDINE 20 MG PO TABS
20.0000 mg | ORAL_TABLET | Freq: Every day | ORAL | 0 refills | Status: DC
  Filled 2022-09-29: qty 30, 30d supply, fill #0

## 2022-09-29 NOTE — Progress Notes (Signed)
    SUBJECTIVE:   CHIEF COMPLAINT / HPI:   32 year old female presenting for skin rash. Patient reports development of diffuse hives on her body First noticed rash 4 weeks ago Rash started after trying cambucha drinks and supplement from [place] containing sea moss, black seed oil, ashwagandha, burdock root Has pet cats at home Hives diffused all over the body including palms and soles of feet Report two episodes of swollen throat, tongue and facial that improved after benadryl. Recently started using new detergent from [employer] Zyrtec hasn't provided any relive e and benadryl has provided inconsistent relieve   PERTINENT  PMH / PSH: Reviewed   OBJECTIVE:   BP 119/65   Pulse 83   Ht 5\' 3"  (1.6 m)   Wt 168 lb 3.2 oz (76.3 kg)   LMP 08/30/2022 (Exact Date)   SpO2 100%   BMI 29.80 kg/m    Physical Exam General: Alert, well appearing, NAD Cardiovascular: RRR, No Murmurs, Normal S2/S2 Respiratory: CTAB, No wheezing or Rales Skin: diffuse erythematous welts and wheals rash over the body. Absent in palms and soles. No oral lesion.  ASSESSMENT/PLAN:   Urticaria Presenting with subacute urticaria with generalized pruritus going on for 4 weeks. Unsure of aggravating factor. Chart review shows patient has allergy for egg however patient denies this and reports continued egg consumption. -Encourage patient to hold on use of over-the-counter/online supplements -Discontinue new detergent and egg consumption -Rx famotidine 20 mg daily, Claritin and twice daily -Rx Epipen and discussed always having it assessable -Placed ambulatory referral to allergist   Jerre Simon, MD [facility]

## 2022-09-29 NOTE — Patient Instructions (Addendum)
It was wonderful to meet you today. Thank you for allowing me to be a part of your care. Below is a short summary of what we discussed at your visit today:  Your skin rash is suspicious of an allergic reaction  I recommend you hold of discontinue her use of eggs.  I have sent in prescription for famotidine which you will take 20 mg daily, prescription for losartan which you will take twice daily at 1 in the morning and 1 in the evening.  I have also sent in prescription for EpiPen signs for allergic reactions.  Show you can reduce which you everywhere.  Also place referral to see an allergist for continued assessment of possible causes of your reactions.  Please avoid using some of those supplements for now until we have these reactions under control.  If you have any questions or concerns, please do not hesitate to contact us via phone or MyChart message.   Jerre Simon, MD [facility]

## 2022-09-29 NOTE — Assessment & Plan Note (Addendum)
Presenting with subacute urticaria with generalized pruritus going on for 4 weeks. Unsure of aggravating factor. Chart review shows patient has allergy for egg however patient denies this and reports continued egg consumption. -Encourage patient to hold on use of over-the-counter/online supplements -Discontinue new detergent and egg consumption -Rx famotidine 20 mg daily, Claritin and twice daily -Rx Epipen and discussed always having it assessable -Placed ambulatory referral to allergist

## 2022-10-22 ENCOUNTER — Other Ambulatory Visit (HOSPITAL_COMMUNITY): Payer: Self-pay

## 2022-10-22 ENCOUNTER — Other Ambulatory Visit: Payer: Self-pay | Admitting: Student

## 2022-10-22 DIAGNOSIS — L509 Urticaria, unspecified: Secondary | ICD-10-CM

## 2022-10-22 MED ORDER — FAMOTIDINE 20 MG PO TABS
20.0000 mg | ORAL_TABLET | Freq: Every day | ORAL | 0 refills | Status: AC
  Filled 2022-10-22 – 2022-10-24 (×2): qty 30, 30d supply, fill #0

## 2022-10-25 ENCOUNTER — Encounter: Payer: Self-pay | Admitting: Pharmacist

## 2022-10-25 ENCOUNTER — Other Ambulatory Visit: Payer: Self-pay

## 2022-10-28 ENCOUNTER — Other Ambulatory Visit: Payer: Self-pay

## 2022-11-01 ENCOUNTER — Other Ambulatory Visit (HOSPITAL_COMMUNITY): Payer: Self-pay

## 2022-11-16 ENCOUNTER — Ambulatory Visit: Payer: 59 | Admitting: Allergy & Immunology

## 2022-12-08 ENCOUNTER — Ambulatory Visit (INDEPENDENT_AMBULATORY_CARE_PROVIDER_SITE_OTHER): Payer: 59

## 2022-12-08 VITALS — BP 109/51 | HR 76 | Wt 177.0 lb

## 2022-12-08 DIAGNOSIS — Z3201 Encounter for pregnancy test, result positive: Secondary | ICD-10-CM | POA: Diagnosis not present

## 2022-12-08 LAB — POCT URINE PREGNANCY: Preg Test, Ur: POSITIVE — AB

## 2022-12-08 NOTE — Progress Notes (Signed)
Patient presents for UPT. UPT; positive. Patient states LMP is 10/27/22. Patient plans to start prenatal care in this office. NOB appointment was scheduled. Jerico Grisso l Treshawn Allen, CMA

## 2022-12-17 ENCOUNTER — Ambulatory Visit: Payer: 59 | Admitting: Internal Medicine

## 2023-01-13 ENCOUNTER — Other Ambulatory Visit: Payer: Self-pay

## 2023-01-13 DIAGNOSIS — Z3490 Encounter for supervision of normal pregnancy, unspecified, unspecified trimester: Secondary | ICD-10-CM

## 2023-01-27 ENCOUNTER — Encounter: Payer: 59 | Admitting: Obstetrics & Gynecology

## 2023-02-10 NOTE — Progress Notes (Signed)
Chart reviewed - agree with CMA/RN documentation.  ° °

## 2023-05-05 ENCOUNTER — Ambulatory Visit: Payer: 59 | Admitting: Family Medicine

## 2023-08-08 ENCOUNTER — Other Ambulatory Visit: Payer: Self-pay | Admitting: Plastic Surgery

## 2023-08-08 ENCOUNTER — Ambulatory Visit: Payer: PRIVATE HEALTH INSURANCE

## 2023-08-08 DIAGNOSIS — K439 Ventral hernia without obstruction or gangrene: Secondary | ICD-10-CM

## 2023-08-08 DIAGNOSIS — Z8719 Personal history of other diseases of the digestive system: Secondary | ICD-10-CM

## 2023-09-27 ENCOUNTER — Encounter: Payer: Self-pay | Admitting: *Deleted
